# Patient Record
Sex: Female | Born: 1940 | Race: White | Hispanic: No | Marital: Married | Smoking: Never smoker
Health system: Southern US, Community
[De-identification: ages and names within clinical notes are randomized; demographics above are authoritative.]

## PROBLEM LIST (undated history)

## (undated) DIAGNOSIS — C801 Malignant (primary) neoplasm, unspecified: Secondary | ICD-10-CM

## (undated) DIAGNOSIS — I10 Essential (primary) hypertension: Secondary | ICD-10-CM

## (undated) DIAGNOSIS — E785 Hyperlipidemia, unspecified: Secondary | ICD-10-CM

## (undated) DIAGNOSIS — Z8489 Family history of other specified conditions: Secondary | ICD-10-CM

## (undated) DIAGNOSIS — E039 Hypothyroidism, unspecified: Secondary | ICD-10-CM

## (undated) DIAGNOSIS — K219 Gastro-esophageal reflux disease without esophagitis: Secondary | ICD-10-CM

## (undated) DIAGNOSIS — J189 Pneumonia, unspecified organism: Secondary | ICD-10-CM

## (undated) DIAGNOSIS — K8050 Calculus of bile duct without cholangitis or cholecystitis without obstruction: Secondary | ICD-10-CM

## (undated) DIAGNOSIS — M109 Gout, unspecified: Secondary | ICD-10-CM

## (undated) DIAGNOSIS — Z85038 Personal history of other malignant neoplasm of large intestine: Secondary | ICD-10-CM

## (undated) DIAGNOSIS — H269 Unspecified cataract: Secondary | ICD-10-CM

## (undated) DIAGNOSIS — K635 Polyp of colon: Secondary | ICD-10-CM

## (undated) DIAGNOSIS — E079 Disorder of thyroid, unspecified: Secondary | ICD-10-CM

## (undated) DIAGNOSIS — K5900 Constipation, unspecified: Secondary | ICD-10-CM

## (undated) HISTORY — DX: Unspecified cataract: H26.9

## (undated) HISTORY — DX: Polyp of colon: K63.5

## (undated) HISTORY — DX: Gout, unspecified: M10.9

## (undated) HISTORY — PX: POLYPECTOMY: SHX149

## (undated) HISTORY — PX: COLON SURGERY: SHX602

## (undated) HISTORY — DX: Essential (primary) hypertension: I10

## (undated) HISTORY — DX: Gastro-esophageal reflux disease without esophagitis: K21.9

## (undated) HISTORY — DX: Constipation, unspecified: K59.00

## (undated) HISTORY — DX: Hyperlipidemia, unspecified: E78.5

## (undated) HISTORY — DX: Disorder of thyroid, unspecified: E07.9

## (undated) HISTORY — PX: APPENDECTOMY: SHX54

## (undated) HISTORY — PX: ABDOMINAL HYSTERECTOMY: SHX81

## (undated) HISTORY — DX: Personal history of other malignant neoplasm of large intestine: Z85.038

## (undated) HISTORY — PX: TOTAL VAGINAL HYSTERECTOMY: SHX2548

---

## 1978-08-22 DIAGNOSIS — Z85038 Personal history of other malignant neoplasm of large intestine: Secondary | ICD-10-CM

## 1978-08-22 HISTORY — DX: Personal history of other malignant neoplasm of large intestine: Z85.038

## 1983-12-06 DIAGNOSIS — C180 Malignant neoplasm of cecum: Secondary | ICD-10-CM | POA: Insufficient documentation

## 2000-06-26 DIAGNOSIS — G9009 Other idiopathic peripheral autonomic neuropathy: Secondary | ICD-10-CM | POA: Insufficient documentation

## 2000-11-14 DIAGNOSIS — R609 Edema, unspecified: Secondary | ICD-10-CM | POA: Insufficient documentation

## 2001-02-12 DIAGNOSIS — D519 Vitamin B12 deficiency anemia, unspecified: Secondary | ICD-10-CM | POA: Insufficient documentation

## 2001-04-16 DIAGNOSIS — M84376A Stress fracture, unspecified foot, initial encounter for fracture: Secondary | ICD-10-CM | POA: Insufficient documentation

## 2003-03-19 DIAGNOSIS — E7800 Pure hypercholesterolemia, unspecified: Secondary | ICD-10-CM | POA: Insufficient documentation

## 2004-05-13 DIAGNOSIS — M109 Gout, unspecified: Secondary | ICD-10-CM | POA: Insufficient documentation

## 2004-08-30 DIAGNOSIS — J159 Unspecified bacterial pneumonia: Secondary | ICD-10-CM | POA: Insufficient documentation

## 2006-11-10 DIAGNOSIS — I272 Pulmonary hypertension, unspecified: Secondary | ICD-10-CM | POA: Insufficient documentation

## 2007-01-24 DIAGNOSIS — G4733 Obstructive sleep apnea (adult) (pediatric): Secondary | ICD-10-CM | POA: Insufficient documentation

## 2007-01-24 DIAGNOSIS — J449 Chronic obstructive pulmonary disease, unspecified: Secondary | ICD-10-CM | POA: Insufficient documentation

## 2007-03-05 DIAGNOSIS — IMO0002 Reserved for concepts with insufficient information to code with codable children: Secondary | ICD-10-CM | POA: Insufficient documentation

## 2007-05-21 ENCOUNTER — Ambulatory Visit: Payer: Self-pay | Admitting: Gastroenterology

## 2007-05-29 ENCOUNTER — Ambulatory Visit: Payer: Self-pay | Admitting: Gastroenterology

## 2007-08-29 DIAGNOSIS — Z79899 Other long term (current) drug therapy: Secondary | ICD-10-CM | POA: Insufficient documentation

## 2007-10-29 DIAGNOSIS — R319 Hematuria, unspecified: Secondary | ICD-10-CM | POA: Insufficient documentation

## 2008-08-20 DIAGNOSIS — J209 Acute bronchitis, unspecified: Secondary | ICD-10-CM | POA: Insufficient documentation

## 2010-12-16 DIAGNOSIS — C4490 Unspecified malignant neoplasm of skin, unspecified: Secondary | ICD-10-CM | POA: Insufficient documentation

## 2011-08-23 DIAGNOSIS — K635 Polyp of colon: Secondary | ICD-10-CM

## 2011-08-23 HISTORY — DX: Polyp of colon: K63.5

## 2011-08-23 HISTORY — PX: COLONOSCOPY: SHX174

## 2012-04-17 ENCOUNTER — Encounter: Payer: Self-pay | Admitting: Gastroenterology

## 2012-05-22 ENCOUNTER — Telehealth: Payer: Self-pay | Admitting: Gastroenterology

## 2012-05-22 NOTE — Telephone Encounter (Signed)
I have ordered her paper chart

## 2012-05-22 NOTE — Telephone Encounter (Signed)
There is no information in [facility] to make this decision. I need her paper chart. Recent office notes from her PCP. Prior colonoscopy reports. If we can't get this she can have an extender visit to put it all together.

## 2012-05-22 NOTE — Telephone Encounter (Signed)
Patient reports a remote history of colon CA.  She reports that she has a BM about every 3 days, but her stools are hard.  She was sent a colon recall letter in August.  Her primary care MD did examine her and told her to follow up with Dr. Fuller Plan that she may have some scarring from her previous colon resection in 1980s.  I have scheduled her for a recall colonoscopy for 06/26/12 and she will come for a pre-visit on 06/12/12.  She is advised to add Miralax prn.  Dr. Fuller Plan is it ok for her to be a direct?

## 2012-05-23 NOTE — Telephone Encounter (Signed)
Paper chart reviewed by Dr. Fuller Plan.  Per Dr. Fuller Plan ok for direct procedure

## 2012-06-12 ENCOUNTER — Ambulatory Visit (AMBULATORY_SURGERY_CENTER): Payer: Medicare Other

## 2012-06-12 VITALS — Ht 65.0 in | Wt 221.5 lb

## 2012-06-12 DIAGNOSIS — Z85038 Personal history of other malignant neoplasm of large intestine: Secondary | ICD-10-CM

## 2012-06-12 DIAGNOSIS — Z800 Family history of malignant neoplasm of digestive organs: Secondary | ICD-10-CM

## 2012-06-12 DIAGNOSIS — Z1211 Encounter for screening for malignant neoplasm of colon: Secondary | ICD-10-CM

## 2012-06-12 MED ORDER — MOVIPREP 100 G PO SOLR: ORAL | Status: DC

## 2012-06-26 ENCOUNTER — Encounter: Payer: Self-pay | Admitting: Gastroenterology

## 2012-06-26 ENCOUNTER — Ambulatory Visit (AMBULATORY_SURGERY_CENTER): Payer: Medicare Other | Admitting: Gastroenterology

## 2012-06-26 VITALS — BP 134/76 | HR 69 | Temp 97.3°F | Resp 16 | Ht 65.0 in | Wt 221.0 lb

## 2012-06-26 DIAGNOSIS — Z85038 Personal history of other malignant neoplasm of large intestine: Secondary | ICD-10-CM

## 2012-06-26 DIAGNOSIS — Z1211 Encounter for screening for malignant neoplasm of colon: Secondary | ICD-10-CM

## 2012-06-26 DIAGNOSIS — D126 Benign neoplasm of colon, unspecified: Secondary | ICD-10-CM

## 2012-06-26 MED ORDER — SODIUM CHLORIDE 0.9 % IV SOLN: 500.0000 mL | INTRAVENOUS | Status: DC

## 2012-06-26 NOTE — Op Note (Signed)
[place]  [address] [place], [id number]   COLONOSCOPY PROCEDURE REPORT  PATIENT: Jacqueline, Gamble  MR#: [id number] BIRTHDATE: 01-Jan-1941 , 71  yrs. old GENDER: Female ENDOSCOPIST: Ladene Artist, MD, [facility] PROCEDURE DATE:  06/26/2012 PROCEDURE:   Colonoscopy with biopsy ASA CLASS:   Class II INDICATIONS: high risk patient with personal history of colon cancer in 1986. MEDICATIONS: MAC sedation, administered by CRNA and propofol (Diprivan) 150mg  IV DESCRIPTION OF PROCEDURE:   After the risks benefits and alternatives of the procedure were thoroughly explained, informed consent was obtained.  A digital rectal exam revealed no abnormalities of the rectum.   The LB [id number]  endoscope was introduced through the anus and advanced to the cecum, which was identified by both the appendix and ileocecal valve. No adverse events experienced.   The quality of the prep was good, using MoviPrep  The instrument was then slowly withdrawn as the colon was fully examined.  COLON FINDINGS: Mild diverticulosis was noted in the descending colon.   A sessile polyp measuring 4 mm in size was found in the transverse colon.  A polypectomy was performed with cold forceps. The resection was complete and the polyp tissue was completely retrieved.   A sessile polyp measuring 3 mm in size was found in the descending colon.  A polypectomy was performed with cold forceps.  The resection was complete and the polyp tissue was completely retrieved.   Prior surgery, anastomosis in sigmoid colon.   The colon was otherwise normal.  There was no diverticulosis, inflammation, polyps or cancers unless previously stated.  Retroflexed views revealed small internal hemorrhoids. The time to cecum=3 minutes 10 seconds.  Withdrawal time=8 minutes 57 seconds.  The scope was withdrawn and the procedure completed. COMPLICATIONS: There were no complications.  ENDOSCOPIC IMPRESSION: 1.   Mild  diverticulosis was noted in the descending colon 2.   Sessile polyp measuring 4 mm in size was found in the transverse colon; polypectomy was performed with cold forceps 3.   Sessile polyp measuring 3 mm in size was found in the descending colon; polypectomy was performed with cold forceps 4.   Prior surgery, anastomosis in sigmoid colon. 5.   Small internal hemorrhoids  RECOMMENDATIONS: 1.  Await pathology results 2.  High fiber diet with liberal fluid intake. 3.  Repeat Colonoscopy in 5 years.  eSigned:  Ladene Artist, MD, [facility] 06/26/2012 2:18 PM

## 2012-06-26 NOTE — Progress Notes (Signed)
Patient did not experience any of the following events: a burn prior to discharge; a fall within the facility; wrong site/side/patient/procedure/implant event; or a hospital transfer or hospital admission upon discharge from the facility. (G8907) Patient did not have preoperative order for IV antibiotic SSI prophylaxis. (G8918)  

## 2012-06-26 NOTE — Progress Notes (Signed)
NAC-VSS Pt tolerated procedure well.Anesthesia uneventful

## 2012-06-26 NOTE — Patient Instructions (Addendum)
YOU HAD AN ENDOSCOPIC PROCEDURE TODAY AT THE [facility]: Refer to the procedure report that was given to you for any specific questions about what was found during the examination.  If the procedure report does not answer your questions, please call your gastroenterologist to clarify.  If you requested that your care partner not be given the details of your procedure findings, then the procedure report has been included in a sealed envelope for you to review at your convenience later.  YOU SHOULD EXPECT: Some feelings of bloating in the abdomen. Passage of more gas than usual.  Walking can help get rid of the air that was put into your GI tract during the procedure and reduce the bloating. If you had a lower endoscopy (such as a colonoscopy or flexible sigmoidoscopy) you may notice spotting of blood in your stool or on the toilet paper. If you underwent a bowel prep for your procedure, then you may not have a normal bowel movement for a few days.  DIET: Your first meal following the procedure should be a light meal and then it is ok to progress to your normal diet.  A half-sandwich or bowl of soup is an example of a good first meal.  Heavy or fried foods are harder to digest and may make you feel nauseous or bloated.  Likewise meals heavy in dairy and vegetables can cause extra gas to form and this can also increase the bloating.  Drink plenty of fluids but you should avoid alcoholic beverages for 24 hours.  ACTIVITY: Your care partner should take you home directly after the procedure.  You should plan to take it easy, moving slowly for the rest of the day.  You can resume normal activity the day after the procedure however you should NOT DRIVE or use heavy machinery for 24 hours (because of the sedation medicines used during the test).    SYMPTOMS TO REPORT IMMEDIATELY: A gastroenterologist can be reached at any hour.  During normal business hours, 8:30 AM to 5:00 PM Monday through Friday,  call [phone].  After hours and on weekends, please call the GI answering service at [phone] who will take a message and have the physician on call contact you.   Following lower endoscopy (colonoscopy or flexible sigmoidoscopy):  Excessive amounts of blood in the stool  Significant tenderness or worsening of abdominal pains  Swelling of the abdomen that is new, acute  Fever of 100F or higher  Following upper endoscopy (EGD)  Vomiting of blood or coffee ground material  New chest pain or pain under the shoulder blades  Painful or persistently difficult swallowing  New shortness of breath  Fever of 100F or higher  Black, tarry-looking stools  FOLLOW UP: If any biopsies were taken you will be contacted by phone or by letter within the next 1-3 weeks.  Call your gastroenterologist if you have not heard about the biopsies in 3 weeks.  Our staff will call the home number listed on your records the next business day following your procedure to check on you and address any questions or concerns that you may have at that time regarding the information given to you following your procedure. This is a courtesy call and so if there is no answer at the home number and we have not heard from you through the emergency physician on call, we will assume that you have returned to your regular daily activities without incident.  SIGNATURES/CONFIDENTIALITY: You and/or your care   partner have signed paperwork which will be entered into your electronic medical record.  These signatures attest to the fact that that the information above on your After Visit Summary has been reviewed and is understood.  Full responsibility of the confidentiality of this discharge information lies with you and/or your care-partner.  

## 2012-06-27 ENCOUNTER — Telehealth: Payer: Self-pay | Admitting: *Deleted

## 2012-06-27 NOTE — Telephone Encounter (Signed)
  Follow up Call-  Call back number 06/26/2012  Post procedure Call Back phone  # [phone]  Permission to leave phone message Yes   Spoke with pt's husband, she was out  Patient questions:  Do you have a fever, pain , or abdominal swelling? no Pain Score  0 *  Have you tolerated food without any problems? yes  Have you been able to return to your normal activities? yes  Do you have any questions about your discharge instructions: Diet   no Medications  no Follow up visit  no  Do you have questions or concerns about your Care? no  Actions: * If pain score is 4 or above: No action needed, pain <4.

## 2012-07-02 ENCOUNTER — Encounter: Payer: Self-pay | Admitting: Gastroenterology

## 2012-07-10 ENCOUNTER — Encounter: Payer: Self-pay | Admitting: Gastroenterology

## 2013-11-04 DIAGNOSIS — K5730 Diverticulosis of large intestine without perforation or abscess without bleeding: Secondary | ICD-10-CM | POA: Insufficient documentation

## 2013-11-11 ENCOUNTER — Encounter (HOSPITAL_COMMUNITY): Payer: Self-pay | Admitting: Emergency Medicine

## 2013-11-11 ENCOUNTER — Emergency Department (HOSPITAL_COMMUNITY): Payer: Medicare Other

## 2013-11-11 ENCOUNTER — Telehealth: Payer: Self-pay | Admitting: Gastroenterology

## 2013-11-11 ENCOUNTER — Encounter: Payer: Self-pay | Admitting: Gastroenterology

## 2013-11-11 ENCOUNTER — Inpatient Hospital Stay (HOSPITAL_COMMUNITY)
Admission: EM | Admit: 2013-11-11 | Discharge: 2013-11-13 | DRG: 419 | Disposition: A | Discharge status: Home / Self Care | Payer: Medicare Other | Attending: General Surgery | Admitting: General Surgery

## 2013-11-11 ENCOUNTER — Ambulatory Visit (INDEPENDENT_AMBULATORY_CARE_PROVIDER_SITE_OTHER): Payer: Medicare Other | Admitting: Gastroenterology

## 2013-11-11 VITALS — BP 168/100 | HR 108 | Ht 65.0 in | Wt 227.0 lb

## 2013-11-11 DIAGNOSIS — K812 Acute cholecystitis with chronic cholecystitis: Secondary | ICD-10-CM

## 2013-11-11 DIAGNOSIS — Z79899 Other long term (current) drug therapy: Secondary | ICD-10-CM

## 2013-11-11 DIAGNOSIS — L299 Pruritus, unspecified: Secondary | ICD-10-CM | POA: Diagnosis present

## 2013-11-11 DIAGNOSIS — R109 Unspecified abdominal pain: Secondary | ICD-10-CM

## 2013-11-11 DIAGNOSIS — I10 Essential (primary) hypertension: Secondary | ICD-10-CM

## 2013-11-11 DIAGNOSIS — K8010 Calculus of gallbladder with chronic cholecystitis without obstruction: Principal | ICD-10-CM | POA: Diagnosis present

## 2013-11-11 DIAGNOSIS — Z860100 Personal history of colon polyps, unspecified: Secondary | ICD-10-CM

## 2013-11-11 DIAGNOSIS — E669 Obesity, unspecified: Secondary | ICD-10-CM | POA: Diagnosis present

## 2013-11-11 DIAGNOSIS — M109 Gout, unspecified: Secondary | ICD-10-CM | POA: Diagnosis present

## 2013-11-11 DIAGNOSIS — Z85038 Personal history of other malignant neoplasm of large intestine: Secondary | ICD-10-CM

## 2013-11-11 DIAGNOSIS — R112 Nausea with vomiting, unspecified: Secondary | ICD-10-CM | POA: Diagnosis present

## 2013-11-11 DIAGNOSIS — E785 Hyperlipidemia, unspecified: Secondary | ICD-10-CM | POA: Diagnosis present

## 2013-11-11 DIAGNOSIS — Z6839 Body mass index (BMI) 39.0-39.9, adult: Secondary | ICD-10-CM

## 2013-11-11 DIAGNOSIS — K219 Gastro-esophageal reflux disease without esophagitis: Secondary | ICD-10-CM

## 2013-11-11 DIAGNOSIS — Z800 Family history of malignant neoplasm of digestive organs: Secondary | ICD-10-CM

## 2013-11-11 DIAGNOSIS — K635 Polyp of colon: Secondary | ICD-10-CM

## 2013-11-11 HISTORY — DX: Malignant (primary) neoplasm, unspecified: C80.1

## 2013-11-11 HISTORY — DX: Hypothyroidism, unspecified: E03.9

## 2013-11-11 LAB — CBC WITH DIFFERENTIAL/PLATELET
Basophils Absolute: 0 10*3/uL (ref 0.0–0.1)
Basophils Relative: 0 % (ref 0–1)
EOS PCT: 3 % (ref 0–5)
Eosinophils Absolute: 0.2 10*3/uL (ref 0.0–0.7)
HEMATOCRIT: 42.7 % (ref 36.0–46.0)
Hemoglobin: 13.8 g/dL (ref 12.0–15.0)
LYMPHS PCT: 23 % (ref 12–46)
Lymphs Abs: 2.1 10*3/uL (ref 0.7–4.0)
MCH: 29.9 pg (ref 26.0–34.0)
MCHC: 32.3 g/dL (ref 30.0–36.0)
MCV: 92.4 fL (ref 78.0–100.0)
Monocytes Absolute: 0.6 10*3/uL (ref 0.1–1.0)
Monocytes Relative: 6 % (ref 3–12)
Neutro Abs: 6 10*3/uL (ref 1.7–7.7)
Neutrophils Relative %: 67 % (ref 43–77)
PLATELETS: 358 10*3/uL (ref 150–400)
RBC: 4.62 MIL/uL (ref 3.87–5.11)
RDW: 16 % — ABNORMAL HIGH (ref 11.5–15.5)
WBC: 8.9 10*3/uL (ref 4.0–10.5)

## 2013-11-11 LAB — COMPREHENSIVE METABOLIC PANEL
ALBUMIN: 3.9 g/dL (ref 3.5–5.2)
ALT: 12 U/L (ref 0–35)
AST: 16 U/L (ref 0–37)
Alkaline Phosphatase: 90 U/L (ref 39–117)
BUN: 16 mg/dL (ref 6–23)
CO2: 28 mEq/L (ref 19–32)
CREATININE: 1.02 mg/dL (ref 0.50–1.10)
Calcium: 10.2 mg/dL (ref 8.4–10.5)
Chloride: 101 mEq/L (ref 96–112)
GFR calc Af Amer: 62 mL/min — ABNORMAL LOW (ref >90)
GFR calc non Af Amer: 53 mL/min — ABNORMAL LOW (ref >90)
Glucose, Bld: 111 mg/dL — ABNORMAL HIGH (ref 70–99)
Potassium: 3.7 mEq/L (ref 3.7–5.3)
Sodium: 142 mEq/L (ref 137–147)
TOTAL PROTEIN: 7.6 g/dL (ref 6.0–8.3)
Total Bilirubin: 0.3 mg/dL (ref 0.3–1.2)

## 2013-11-11 LAB — LIPASE, BLOOD: Lipase: 21 U/L (ref 11–59)

## 2013-11-11 MED ORDER — LIP MEDEX EX OINT
1.0000 | TOPICAL_OINTMENT | Freq: Two times a day (BID) | CUTANEOUS | Status: DC
Start: 2013-11-11 — End: 2013-11-13
  Administered 2013-11-11 – 2013-11-13 (×2): 1 via TOPICAL
  Filled 2013-11-11 (×2): qty 7

## 2013-11-11 MED ORDER — ACETAMINOPHEN 650 MG RE SUPP: 650.0000 mg | Freq: Four times a day (QID) | RECTAL | Status: DC | PRN

## 2013-11-11 MED ORDER — IOHEXOL 300 MG/ML  SOLN
100.0000 mL | Freq: Once | INTRAMUSCULAR | Status: AC | PRN
  Administered 2013-11-11: 100 mL via INTRAVENOUS

## 2013-11-11 MED ORDER — HYDRALAZINE HCL 100 MG PO TABS
100.0000 mg | ORAL_TABLET | Freq: Every day | ORAL | Status: DC
  Administered 2013-11-13: 100 mg via ORAL
  Filled 2013-11-11 (×2): qty 1

## 2013-11-11 MED ORDER — HEPARIN SODIUM (PORCINE) 5000 UNIT/ML IJ SOLN
5000.0000 [IU] | Freq: Three times a day (TID) | INTRAMUSCULAR | Status: DC
  Filled 2013-11-11 (×2): qty 1

## 2013-11-11 MED ORDER — PANTOPRAZOLE SODIUM 40 MG PO TBEC
40.0000 mg | DELAYED_RELEASE_TABLET | Freq: Every day | ORAL | Status: DC
  Administered 2013-11-13: 40 mg via ORAL
  Filled 2013-11-11 (×2): qty 1

## 2013-11-11 MED ORDER — SODIUM CHLORIDE 0.9 % IV SOLN
3.0000 g | Freq: Four times a day (QID) | INTRAVENOUS | Status: DC
  Administered 2013-11-11 – 2013-11-13 (×7): 3 g via INTRAVENOUS
  Filled 2013-11-11 (×9): qty 3

## 2013-11-11 MED ORDER — PROMETHAZINE HCL 25 MG/ML IJ SOLN: 6.2500 mg | Freq: Four times a day (QID) | INTRAMUSCULAR | Status: DC | PRN

## 2013-11-11 MED ORDER — LEVOTHYROXINE SODIUM 75 MCG PO TABS
75.0000 ug | ORAL_TABLET | Freq: Every day | ORAL | Status: DC
  Administered 2013-11-13: 75 ug via ORAL
  Filled 2013-11-11 (×3): qty 1

## 2013-11-11 MED ORDER — PROMETHAZINE HCL 25 MG/ML IJ SOLN
6.2500 mg | Freq: Four times a day (QID) | INTRAMUSCULAR | Status: DC | PRN
  Administered 2013-11-12: 12.5 mg via INTRAVENOUS
  Filled 2013-11-11: qty 1

## 2013-11-11 MED ORDER — DIPHENHYDRAMINE HCL 12.5 MG/5ML PO ELIX: 12.5000 mg | ORAL_SOLUTION | Freq: Four times a day (QID) | ORAL | Status: DC | PRN

## 2013-11-11 MED ORDER — ACETAMINOPHEN 325 MG PO TABS: 650.0000 mg | ORAL_TABLET | Freq: Four times a day (QID) | ORAL | Status: DC | PRN

## 2013-11-11 MED ORDER — POLYETHYLENE GLYCOL 3350 17 G PO PACK
17.0000 g | PACK | Freq: Two times a day (BID) | ORAL | Status: DC
  Administered 2013-11-11 – 2013-11-13 (×3): 17 g via ORAL
  Filled 2013-11-11 (×5): qty 1

## 2013-11-11 MED ORDER — ONDANSETRON HCL 4 MG/2ML IJ SOLN: 4.0000 mg | Freq: Four times a day (QID) | INTRAMUSCULAR | Status: DC | PRN

## 2013-11-11 MED ORDER — ALLOPURINOL 300 MG PO TABS
300.0000 mg | ORAL_TABLET | Freq: Every day | ORAL | Status: DC
  Administered 2013-11-13: 300 mg via ORAL
  Filled 2013-11-11 (×2): qty 1

## 2013-11-11 MED ORDER — BISACODYL 10 MG RE SUPP: 10.0000 mg | Freq: Two times a day (BID) | RECTAL | Status: DC | PRN

## 2013-11-11 MED ORDER — METOPROLOL TARTRATE 1 MG/ML IV SOLN
5.0000 mg | Freq: Four times a day (QID) | INTRAVENOUS | Status: DC | PRN
  Filled 2013-11-11: qty 5

## 2013-11-11 MED ORDER — OXYCODONE HCL 5 MG PO TABS
5.0000 mg | ORAL_TABLET | ORAL | Status: DC | PRN
  Administered 2013-11-11: 10 mg via ORAL
  Filled 2013-11-11: qty 2

## 2013-11-11 MED ORDER — IOHEXOL 300 MG/ML  SOLN
50.0000 mL | Freq: Once | INTRAMUSCULAR | Status: AC | PRN
  Administered 2013-11-11: 50 mL via ORAL

## 2013-11-11 MED ORDER — ASPIRIN 81 MG PO CHEW
81.0000 mg | CHEWABLE_TABLET | Freq: Every day | ORAL | Status: DC
  Administered 2013-11-13: 81 mg via ORAL
  Filled 2013-11-11 (×2): qty 1

## 2013-11-11 MED ORDER — LACTATED RINGERS IV SOLN
INTRAVENOUS | Status: DC
  Administered 2013-11-11 – 2013-11-12 (×4): via INTRAVENOUS
  Administered 2013-11-13: 75 mL via INTRAVENOUS

## 2013-11-11 MED ORDER — HYDROMORPHONE HCL PF 1 MG/ML IJ SOLN
0.5000 mg | INTRAMUSCULAR | Status: DC | PRN
  Administered 2013-11-12 (×2): 1 mg via INTRAVENOUS
  Filled 2013-11-11 (×2): qty 1

## 2013-11-11 MED ORDER — LACTATED RINGERS IV BOLUS (SEPSIS)
1000.0000 mL | Freq: Once | INTRAVENOUS | Status: AC
Start: 2013-11-11 — End: 2013-11-11
  Administered 2013-11-11: 1000 mL via INTRAVENOUS

## 2013-11-11 MED ORDER — DIPHENHYDRAMINE HCL 50 MG/ML IJ SOLN
12.5000 mg | Freq: Four times a day (QID) | INTRAMUSCULAR | Status: DC | PRN
  Administered 2013-11-12: 25 mg via INTRAVENOUS
  Filled 2013-11-11: qty 1

## 2013-11-11 MED ORDER — LACTATED RINGERS IV BOLUS (SEPSIS): 1000.0000 mL | Freq: Three times a day (TID) | INTRAVENOUS | Status: DC | PRN

## 2013-11-11 MED ORDER — POLYETHYLENE GLYCOL 3350 17 G PO PACK
17.0000 g | PACK | Freq: Two times a day (BID) | ORAL | Status: DC | PRN
  Filled 2013-11-11: qty 1

## 2013-11-11 MED ORDER — SUCRALFATE 1 GM/10ML PO SUSP
1.0000 g | Freq: Three times a day (TID) | ORAL | Status: DC
  Administered 2013-11-11 – 2013-11-13 (×5): 1 g via ORAL
  Filled 2013-11-11 (×10): qty 10

## 2013-11-11 MED ORDER — ALUM & MAG HYDROXIDE-SIMETH 200-200-20 MG/5ML PO SUSP: 30.0000 mL | Freq: Four times a day (QID) | ORAL | Status: DC | PRN

## 2013-11-11 NOTE — Patient Instructions (Signed)
Please go to the emergency room at  Hospital. 

## 2013-11-11 NOTE — Telephone Encounter (Signed)
Patient reports constipation and she was recently admitted to [facility] with the same.  She will bring the records.  She will come in and see Alonza Bogus, PA today at 3:30

## 2013-11-11 NOTE — ED Notes (Addendum)
Pt states she was hosptitalized im [place] on Thursday for small bowel obstruction. Pt states she has had pain with eating since then. No n/v/d pt states lower abd hurts with eating. Pt states she has had several inches of small intestine removed due to SBO, and several inches of large intestine removed due to colon ca several years ago.

## 2013-11-11 NOTE — Consult Note (Signed)
[place], MD, [address] [place], [place] [address] Phone: [phone] FAX: [place]  1941/06/27 [id number]  CARE TEAM:  PCP: Galen Manila, MD  Outpatient Care Team: Patient Care Team: Galen Manila as PCP - General (Internal Medicine)  Inpatient Treatment Team: Treatment Team: Attending Provider: Dot Lanes, MD; Registered Nurse: Susette Racer, RN; Registered Nurse: Waunita Schooner, RN  This patient is a 73 y.o.female who presents today for surgical evaluation at the request of Dr Audie Pinto.   Reason for evaluation: Postprandial nausea vomiting and upper abdominal pain.  Gallstones.  Pleasant obese female who has struggled with daily nausea and vomiting after eating for the past several weeks.  She is a distant history of and colon cancer requiring operation resection by Dr Lennie Hummer in 1980.  Then the had had to have reoperation 4 years later concerning for bowel obstruction.  No problems since.  She has been followed by low-power gastroenterology with endoscopies.  Last colonoscopy revealed tubular adenomas in 2013.  She has a bowel movement about every other day.  No major change in caliber of her stools.  No personal nor family history of inflammatory bowel disease, irritable bowel syndrome, allergy such as Celiac Sprue, dietary/dairy problems, colitis, ulcers nor gastritis.  No recent sick contacts/gastroenteritis.  No travel outside the country.  No changes in diet.  No dysphagia to solids or liquids.  No significant heartburn or reflux but is on a daily PPI.Marland Kitchen  No hematochezia, hematemesis, coffee ground emesis.  No evidence of prior gastric/peptic ulceration.  Was admitted up in [place], [place], where she lives, over concerns of a bowel obstruction.  Apparently stay for 5 days.  Because of her daily vomiting and other concerns, she went to seek  gastroenterology.  Apparently in tears, feeling uncomfortable.  Recommended coming to emergency room.  CAT scan argues against SBO / obstruction.  Large gallstone and gallbladder noted.  Some mild pericholecystic fluid as well.  Pain seemed to be more in right upper abdomen.      Past Medical History  Diagnosis Date  . Hypertension   . Gout   . Thyroid disease   . History of colon cancer 1980  . Colon polyp 2013    TUBULAR ADENOMA  . Hyperlipemia   . GERD (gastroesophageal reflux disease)     Past Surgical History  Procedure Laterality Date  . Colonoscopy  2013  . Colon surgery       2 times/1980 and 1983  . Total vaginal hysterectomy    . Appendectomy      History   Social History  . Marital Status: Married    Spouse Name: N/A    Number of Children: N/A  . Years of Education: N/A   Occupational History  . Not on file.   Social History Main Topics  . Smoking status: Never Smoker   . Smokeless tobacco: Never Used  . Alcohol Use: No  . Drug Use: No  . Sexual Activity: Not on file   Other Topics Concern  . Not on file   Social History Narrative  . No narrative on file    Family History  Problem Relation Age of Onset  . Colon cancer Maternal Grandfather   . Colon polyps Son     No current facility-administered medications for this encounter.   Current Outpatient Prescriptions  Medication Sig  Dispense Refill  . aliskiren (TEKTURNA) 150 MG tablet Take 150 mg by mouth daily.      Marland Kitchen allopurinol (ZYLOPRIM) 300 MG tablet Take 300 mg by mouth daily.      Marland Kitchen aspirin 81 MG tablet Take 81 mg by mouth daily.      Marland Kitchen docusate sodium (COLACE) 100 MG capsule Take 100 mg by mouth 2 (two) times daily.      . hydrALAZINE (APRESOLINE) 100 MG tablet Take 100 mg by mouth daily.      Marland Kitchen levothyroxine (SYNTHROID, LEVOTHROID) 75 MCG tablet Take 75 mcg by mouth daily before breakfast.      . Multiple Vitamins-Calcium (ONE-A-DAY WOMENS FORMULA PO) Take by mouth daily.      .  pantoprazole (PROTONIX) 40 MG tablet Take 40 mg by mouth daily.      . pravastatin (PRAVACHOL) 40 MG tablet Take 40 mg by mouth daily.      Marland Kitchen torsemide (DEMADEX) 20 MG tablet Take 20 mg by mouth daily.         Allergies  Allergen Reactions  . Iodine Swelling  . Codeine Itching and Rash    ROS: Constitutional:  No fevers, chills, sweats.  Weight stable Eyes:  No vision changes, No discharge HENT:  No sore throats, nasal drainage Lymph: No neck swelling, No bruising easily Pulmonary:  No cough, productive sputum CV: No orthopnea, PND  Patient walks 15 minutes for about 1/4 miles without difficulty.  No exertional chest/neck/shoulder/arm pain. GI:  No personal nor family history of inflammatory bowel disease, irritable bowel syndrome, allergy such as Celiac Sprue, dietary/dairy problems, colitis, ulcers nor gastritis.  No recent sick contacts/gastroenteritis.  No travel outside the country.  No changes in diet. Renal: No UTIs, No hematuria Genital:  No drainage, bleeding, masses Musculoskeletal: No severe joint pain.  Good ROM major joints Skin:  No sores or lesions.  No rashes Heme/Lymph:  No easy bleeding.  No swollen lymph nodes Neuro: No focal weakness/numbness.  No seizures Psych: No suicidal ideation.  No hallucinations  BP 182/88  Pulse 88  Temp(Src) 98 F (36.7 C) (Oral)  Resp 20  SpO2 95%  Physical Exam: General: Pt awake/alert/oriented x4 in no major acute distress Eyes: PERRL, normal EOM. Sclera nonicteric Neuro: CN II-XII intact w/o focal sensory/motor deficits. Lymph: No head/neck/groin lymphadenopathy Psych:  No delerium/psychosis/paranoia HENT: Normocephalic, Mucus membranes moist.  No thrush Neck: Supple, No tracheal deviation Chest: No pain.  Good respiratory excursion. CV:  Pulses intact.  Regular rhythm Abdomen: Soft, Nondistended.  Mod tender RUQ with mild Murphy sign to deep palpation.  Rest of the abdomen soft and nontender..  No incarcerated  hernias. Ext:  SCDs BLE.  No significant edema.  No cyanosis Skin: No petechiae / purpurea.  No major sores Musculoskeletal: No severe joint pain.  Good ROM major joints   Results:   Labs: Results for orders placed during the hospital encounter of 11/11/13 (from the past 48 hour(s))  LIPASE, BLOOD     Status: None   Collection Time    11/11/13  6:40 PM      Result Value Ref Range   Lipase 21  11 - 59 U/L  COMPREHENSIVE METABOLIC PANEL     Status: Abnormal   Collection Time    11/11/13  6:40 PM      Result Value Ref Range   Sodium 142  137 - 147 mEq/L   Potassium 3.7  3.7 - 5.3 mEq/L   Chloride 101  96 - 112 mEq/L   CO2 28  19 - 32 mEq/L   Glucose, Bld 111 (*) 70 - 99 mg/dL   BUN 16  6 - 23 mg/dL   Creatinine, Ser 1.02  0.50 - 1.10 mg/dL   Calcium 10.2  8.4 - 10.5 mg/dL   Total Protein 7.6  6.0 - 8.3 g/dL   Albumin 3.9  3.5 - 5.2 g/dL   AST 16  0 - 37 U/L   ALT 12  0 - 35 U/L   Alkaline Phosphatase 90  39 - 117 U/L   Total Bilirubin 0.3  0.3 - 1.2 mg/dL   GFR calc non Af Amer 53 (*) >90 mL/min   GFR calc Af Amer 62 (*) >90 mL/min   Comment: (NOTE)     The eGFR has been calculated using the CKD EPI equation.     This calculation has not been validated in all clinical situations.     eGFR's persistently <90 mL/min signify possible Chronic Kidney     Disease.  CBC WITH DIFFERENTIAL     Status: Abnormal   Collection Time    11/11/13  6:40 PM      Result Value Ref Range   WBC 8.9  4.0 - 10.5 K/uL   RBC 4.62  3.87 - 5.11 MIL/uL   Hemoglobin 13.8  12.0 - 15.0 g/dL   HCT 42.7  36.0 - 46.0 %   MCV 92.4  78.0 - 100.0 fL   MCH 29.9  26.0 - 34.0 pg   MCHC 32.3  30.0 - 36.0 g/dL   RDW 16.0 (*) 11.5 - 15.5 %   Platelets 358  150 - 400 K/uL   Neutrophils Relative % 67  43 - 77 %   Neutro Abs 6.0  1.7 - 7.7 K/uL   Lymphocytes Relative 23  12 - 46 %   Lymphs Abs 2.1  0.7 - 4.0 K/uL   Monocytes Relative 6  3 - 12 %   Monocytes Absolute 0.6  0.1 - 1.0 K/uL   Eosinophils  Relative 3  0 - 5 %   Eosinophils Absolute 0.2  0.0 - 0.7 K/uL   Basophils Relative 0  0 - 1 %   Basophils Absolute 0.0  0.0 - 0.1 K/uL    Imaging / Studies: Ct Abdomen Pelvis W Contrast  11/11/2013   CLINICAL DATA:  73 year old female with abdominal and pelvic pain. History of colon cancer and prior small bowel obstructions.  EXAM: CT ABDOMEN AND PELVIS WITH CONTRAST  TECHNIQUE: Multidetector CT imaging of the abdomen and pelvis was performed using the standard protocol following bolus administration of intravenous contrast.  CONTRAST:  149m OMNIPAQUE IOHEXOL 300 MG/ML  SOLN  COMPARISON:  None.  FINDINGS: The liver, spleen, adrenal glands, pancreas are unremarkable.  Cholelithiasis identify with possible mall pericholecystic inflammation and acute cholecystitis is not excluded.  Left renal atrophy and right renal cysts are present.  There is no evidence of free fluid, enlarged lymph nodes, biliary dilation or abdominal aortic aneurysm.  Prior bowel surgery noted.  There is no evidence of bowel obstruction, pneumoperitoneum or abscess.  The patient is status post hysterectomy.  The bladder is unremarkable.  No acute or suspicious bony abnormalities are present.  IMPRESSION: Cholelithiasis with possible mild pericholecystic inflammation. Acute cholecystitis is not excluded and consider further evaluation with ultrasound or nuclear medicine study.   Electronically Signed   By: JHassan RowanM.D.   On: 11/11/2013 21:01    Medications /  Allergies: per chart  Antibiotics: Anti-infectives   None      Assessment  [name]  73 y.o. female       Problem List:  Principal Problem:   Acute cholecystitis with chronic cholecystitis Active Problems:   Personal history of colonic polyps   History of colon cancer   GERD (gastroesophageal reflux disease)   Hypertension   Obesity (BMI 30-39.9)   Postprandial nausea vomiting without constant pain suspicious for acute on chronic  cholecystitis  Plan:  Admit.  IV antibiotics.  Unison for biliary coverage.  Progressive nausea control.  Cholecystectomy.  Reasonable to start a laparoscopically area and I discussed with the patient and her significant other:  The anatomy & physiology of hepatobiliary & pancreatic function was discussed.  The pathophysiology of gallbladder dysfunction was discussed.  Natural history risks without surgery was discussed.   I feel the risks of no intervention will lead to serious problems that outweigh the operative risks; therefore, I recommended cholecystectomy to remove the pathology.  I explained laparoscopic techniques with possible need for an open approach.  Probable cholangiogram to evaluate the bilary tract was explained as well.    Risks such as bleeding, infection, abscess, leak, injury to other organs, need for further treatment, heart attack, death, and other risks were discussed.  I noted a good likelihood this will help address the problem.  Possibility that this will not correct all abdominal symptoms was explained.  Goals of post-operative recovery were discussed as well.  We will work to minimize complications.  An educational handout further explaining the pathology and treatment options was given as well.  Questions were answered.  The patient expresses understanding & wishes to proceed with surgery.  Follow hypertension.  Control when necessary.  Continue allopurinol for gout.  -VTE prophylaxis- SCDs, etc.  Hold heparin until after surgery.  Anticipate surgery in a.m.  -mobilize as tolerated to help recovery    Adin Hector, M.D., F.A.C.S. Gastrointestinal and Minimally Invasive Surgery [facility] [address] [place],  [address] [phone] Main / Paging   11/11/2013  Note: This dictation was prepared with Dragon/digital dictation along with [facility] technology. Any transcriptional errors that result from  this process are unintentional.

## 2013-11-12 ENCOUNTER — Encounter (HOSPITAL_COMMUNITY): Payer: Self-pay

## 2013-11-12 ENCOUNTER — Inpatient Hospital Stay (HOSPITAL_COMMUNITY): Payer: Medicare Other | Admitting: Certified Registered Nurse Anesthetist

## 2013-11-12 ENCOUNTER — Encounter (HOSPITAL_COMMUNITY): Admission: EM | Disposition: A | Discharge status: Home / Self Care | Payer: Self-pay | Source: Home / Self Care

## 2013-11-12 ENCOUNTER — Encounter: Payer: Self-pay | Admitting: Gastroenterology

## 2013-11-12 ENCOUNTER — Encounter (HOSPITAL_COMMUNITY): Payer: Medicare Other | Admitting: Certified Registered Nurse Anesthetist

## 2013-11-12 ENCOUNTER — Inpatient Hospital Stay (HOSPITAL_COMMUNITY): Payer: Medicare Other

## 2013-11-12 DIAGNOSIS — R109 Unspecified abdominal pain: Secondary | ICD-10-CM | POA: Insufficient documentation

## 2013-11-12 DIAGNOSIS — K8010 Calculus of gallbladder with chronic cholecystitis without obstruction: Secondary | ICD-10-CM

## 2013-11-12 HISTORY — PX: CHOLECYSTECTOMY: SHX55

## 2013-11-12 LAB — SURGICAL PCR SCREEN
MRSA, PCR: NEGATIVE
Staphylococcus aureus: NEGATIVE

## 2013-11-12 SURGERY — LAPAROSCOPIC CHOLECYSTECTOMY WITH INTRAOPERATIVE CHOLANGIOGRAM
Anesthesia: General | Site: Abdomen

## 2013-11-12 MED ORDER — PROMETHAZINE HCL 25 MG/ML IJ SOLN
6.2500 mg | INTRAMUSCULAR | Status: DC | PRN
  Administered 2013-11-12: 6.25 mg via INTRAVENOUS

## 2013-11-12 MED ORDER — GLYCOPYRROLATE 0.2 MG/ML IJ SOLN
INTRAMUSCULAR | Status: DC | PRN
  Administered 2013-11-12: .8 mg via INTRAVENOUS

## 2013-11-12 MED ORDER — PROPOFOL 10 MG/ML IV BOLUS
INTRAVENOUS | Status: AC
  Filled 2013-11-12: qty 20

## 2013-11-12 MED ORDER — NEOSTIGMINE METHYLSULFATE 1 MG/ML IJ SOLN
INTRAMUSCULAR | Status: AC
  Filled 2013-11-12: qty 10

## 2013-11-12 MED ORDER — ROCURONIUM BROMIDE 100 MG/10ML IV SOLN
INTRAVENOUS | Status: DC | PRN
  Administered 2013-11-12: 40 mg via INTRAVENOUS

## 2013-11-12 MED ORDER — ROCURONIUM BROMIDE 100 MG/10ML IV SOLN
INTRAVENOUS | Status: AC
  Filled 2013-11-12: qty 1

## 2013-11-12 MED ORDER — SODIUM CHLORIDE 0.9 % IJ SOLN
INTRAMUSCULAR | Status: DC | PRN
  Administered 2013-11-12: 13:00:00

## 2013-11-12 MED ORDER — SUCCINYLCHOLINE CHLORIDE 20 MG/ML IJ SOLN
INTRAMUSCULAR | Status: DC | PRN
  Administered 2013-11-12: 140 mg via INTRAVENOUS

## 2013-11-12 MED ORDER — HYDROMORPHONE HCL PF 1 MG/ML IJ SOLN
INTRAMUSCULAR | Status: AC
  Filled 2013-11-12: qty 1

## 2013-11-12 MED ORDER — DEXAMETHASONE SODIUM PHOSPHATE 10 MG/ML IJ SOLN
INTRAMUSCULAR | Status: DC | PRN
  Administered 2013-11-12: 10 mg via INTRAVENOUS

## 2013-11-12 MED ORDER — DIPHENHYDRAMINE HCL 50 MG/ML IJ SOLN
12.5000 mg | Freq: Once | INTRAMUSCULAR | Status: AC
  Administered 2013-11-12: 12.5 mg via INTRAVENOUS

## 2013-11-12 MED ORDER — FENTANYL CITRATE 0.05 MG/ML IJ SOLN
INTRAMUSCULAR | Status: AC
  Filled 2013-11-12: qty 2

## 2013-11-12 MED ORDER — LIDOCAINE HCL (CARDIAC) 20 MG/ML IV SOLN
INTRAVENOUS | Status: AC
  Filled 2013-11-12: qty 5

## 2013-11-12 MED ORDER — 0.9 % SODIUM CHLORIDE (POUR BTL) OPTIME
TOPICAL | Status: DC | PRN
  Administered 2013-11-12: 1000 mL

## 2013-11-12 MED ORDER — TRAMADOL HCL 50 MG PO TABS: 100.0000 mg | ORAL_TABLET | Freq: Two times a day (BID) | ORAL | Status: DC | PRN

## 2013-11-12 MED ORDER — PROMETHAZINE HCL 25 MG/ML IJ SOLN
INTRAMUSCULAR | Status: AC
  Filled 2013-11-12: qty 1

## 2013-11-12 MED ORDER — HYDROMORPHONE HCL PF 1 MG/ML IJ SOLN
0.2500 mg | INTRAMUSCULAR | Status: DC | PRN
  Administered 2013-11-12: 0.5 mg via INTRAVENOUS

## 2013-11-12 MED ORDER — LIDOCAINE HCL (CARDIAC) 20 MG/ML IV SOLN
INTRAVENOUS | Status: DC | PRN
  Administered 2013-11-12: 100 mg via INTRAVENOUS

## 2013-11-12 MED ORDER — MEPERIDINE HCL 50 MG/ML IJ SOLN: 6.2500 mg | INTRAMUSCULAR | Status: DC | PRN

## 2013-11-12 MED ORDER — MIDAZOLAM HCL 2 MG/2ML IJ SOLN
INTRAMUSCULAR | Status: AC
  Filled 2013-11-12: qty 2

## 2013-11-12 MED ORDER — NEOSTIGMINE METHYLSULFATE 1 MG/ML IJ SOLN
INTRAMUSCULAR | Status: DC | PRN
  Administered 2013-11-12: 5 mg via INTRAVENOUS

## 2013-11-12 MED ORDER — PROPOFOL 10 MG/ML IV BOLUS
INTRAVENOUS | Status: DC | PRN
  Administered 2013-11-12: 170 mg via INTRAVENOUS

## 2013-11-12 MED ORDER — DIPHENHYDRAMINE HCL 50 MG/ML IJ SOLN
INTRAMUSCULAR | Status: AC
Start: 2013-11-12 — End: 2013-11-13
  Filled 2013-11-12: qty 1

## 2013-11-12 MED ORDER — GLYCOPYRROLATE 0.2 MG/ML IJ SOLN
INTRAMUSCULAR | Status: AC
  Filled 2013-11-12: qty 3

## 2013-11-12 MED ORDER — FENTANYL CITRATE 0.05 MG/ML IJ SOLN
INTRAMUSCULAR | Status: AC
  Filled 2013-11-12: qty 5

## 2013-11-12 MED ORDER — PHENYLEPHRINE HCL 10 MG/ML IJ SOLN
INTRAMUSCULAR | Status: DC | PRN
  Administered 2013-11-12: 80 ug via INTRAVENOUS
  Administered 2013-11-12: 40 ug via INTRAVENOUS

## 2013-11-12 MED ORDER — DEXAMETHASONE SODIUM PHOSPHATE 10 MG/ML IJ SOLN
INTRAMUSCULAR | Status: AC
  Filled 2013-11-12: qty 1

## 2013-11-12 MED ORDER — BUPIVACAINE-EPINEPHRINE PF 0.25-1:200000 % IJ SOLN
INTRAMUSCULAR | Status: AC
  Filled 2013-11-12: qty 30

## 2013-11-12 MED ORDER — PHENYLEPHRINE 40 MCG/ML (10ML) SYRINGE FOR IV PUSH (FOR BLOOD PRESSURE SUPPORT)
PREFILLED_SYRINGE | INTRAVENOUS | Status: AC
  Filled 2013-11-12: qty 10

## 2013-11-12 MED ORDER — LACTATED RINGERS IV SOLN
INTRAVENOUS | Status: DC | PRN
  Administered 2013-11-12: 1000 mL

## 2013-11-12 MED ORDER — FENTANYL CITRATE 0.05 MG/ML IJ SOLN
INTRAMUSCULAR | Status: DC | PRN
  Administered 2013-11-12 (×3): 50 ug via INTRAVENOUS
  Administered 2013-11-12: 100 ug via INTRAVENOUS

## 2013-11-12 MED ORDER — BUPIVACAINE-EPINEPHRINE 0.25% -1:200000 IJ SOLN
INTRAMUSCULAR | Status: DC | PRN
  Administered 2013-11-12: 20 mL

## 2013-11-12 MED ORDER — HEPARIN SODIUM (PORCINE) 5000 UNIT/ML IJ SOLN
5000.0000 [IU] | Freq: Three times a day (TID) | INTRAMUSCULAR | Status: DC
  Administered 2013-11-13 (×2): 5000 [IU] via SUBCUTANEOUS
  Filled 2013-11-12 (×4): qty 1

## 2013-11-12 MED ORDER — DIPHENHYDRAMINE HCL 50 MG/ML IJ SOLN
12.5000 mg | INTRAMUSCULAR | Status: AC
  Administered 2013-11-12: 12.5 mg via INTRAVENOUS

## 2013-11-12 MED ORDER — ONDANSETRON HCL 4 MG/2ML IJ SOLN
INTRAMUSCULAR | Status: AC
  Filled 2013-11-12: qty 2

## 2013-11-12 MED ORDER — ONDANSETRON HCL 4 MG/2ML IJ SOLN
INTRAMUSCULAR | Status: DC | PRN
  Administered 2013-11-12: 4 mg via INTRAVENOUS

## 2013-11-12 SURGICAL SUPPLY — 35 items
ADH SKN CLS APL DERMABOND .7 (GAUZE/BANDAGES/DRESSINGS) ×1
APPLIER CLIP ROT 10 11.4 M/L (STAPLE) ×2
APR CLP MED LRG 11.4X10 (STAPLE) ×1
BAG SPEC RTRVL LRG 6X4 10 (ENDOMECHANICALS) ×1
CANISTER SUCTION 2500CC (MISCELLANEOUS) ×2 IMPLANT
CATH REDDICK CHOLANGI 4FR 50CM (CATHETERS) ×2 IMPLANT
CHLORAPREP W/TINT 26ML (MISCELLANEOUS) ×2 IMPLANT
CLIP APPLIE ROT 10 11.4 M/L (STAPLE) ×1 IMPLANT
COVER MAYO STAND STRL (DRAPES) ×2 IMPLANT
DECANTER SPIKE VIAL GLASS SM (MISCELLANEOUS) ×1 IMPLANT
DERMABOND ADVANCED (GAUZE/BANDAGES/DRESSINGS) ×1
DERMABOND ADVANCED .7 DNX12 (GAUZE/BANDAGES/DRESSINGS) ×1 IMPLANT
DRAPE C-ARM 42X120 X-RAY (DRAPES) ×2 IMPLANT
DRAPE LAPAROSCOPIC ABDOMINAL (DRAPES) ×2 IMPLANT
ELECT REM PT RETURN 9FT ADLT (ELECTROSURGICAL) ×2
ELECTRODE REM PT RTRN 9FT ADLT (ELECTROSURGICAL) ×1 IMPLANT
GLOVE BIO SURGEON STRL SZ7.5 (GLOVE) ×5 IMPLANT
GOWN STRL REUS W/ TWL XL LVL3 (GOWN DISPOSABLE) IMPLANT
GOWN STRL REUS W/TWL LRG LVL3 (GOWN DISPOSABLE) ×1 IMPLANT
GOWN STRL REUS W/TWL XL LVL3 (GOWN DISPOSABLE) ×6 IMPLANT
HEMOSTAT SURGICEL 4X8 (HEMOSTASIS) ×2 IMPLANT
IV CATH 14GX2 1/4 (CATHETERS) ×2 IMPLANT
KIT BASIN OR (CUSTOM PROCEDURE TRAY) ×2 IMPLANT
POUCH SPECIMEN RETRIEVAL 10MM (ENDOMECHANICALS) ×1 IMPLANT
SET IRRIG TUBING LAPAROSCOPIC (IRRIGATION / IRRIGATOR) ×2 IMPLANT
SOLUTION ANTI FOG 6CC (MISCELLANEOUS) ×2 IMPLANT
SUT MNCRL AB 4-0 PS2 18 (SUTURE) ×2 IMPLANT
TOWEL OR 17X26 10 PK STRL BLUE (TOWEL DISPOSABLE) ×2 IMPLANT
TOWEL OR NON WOVEN STRL DISP B (DISPOSABLE) ×2 IMPLANT
TRAY LAP CHOLE (CUSTOM PROCEDURE TRAY) ×2 IMPLANT
TROCAR BLADELESS OPT 5 75 (ENDOMECHANICALS) ×2 IMPLANT
TROCAR SLEEVE XCEL 5X75 (ENDOMECHANICALS) ×3 IMPLANT
TROCAR XCEL BLUNT TIP 100MML (ENDOMECHANICALS) ×1 IMPLANT
TROCAR XCEL NON-BLD 11X100MML (ENDOMECHANICALS) ×2 IMPLANT
TUBING INSUFFLATION 10FT LAP (TUBING) ×2 IMPLANT

## 2013-11-12 NOTE — Transfer of Care (Signed)
Immediate Anesthesia Transfer of Care Note  Patient: Jacqueline Gamble  Procedure(s) Performed: Procedure(s) (LRB): LAPAROSCOPIC CHOLECYSTECTOMY WITH INTRAOPERATIVE CHOLANGIOGRAM (N/A)  Patient Location: PACU  Anesthesia Type: General  Level of Consciousness: sedated, patient cooperative and responds to stimulation  Airway & Oxygen Therapy: Patient Spontanous Breathing and Patient connected to face mask oxgen  Post-op Assessment: Report given to PACU RN and Post -op Vital signs reviewed and stable  Post vital signs: Reviewed and stable  Complications: No apparent anesthesia complications

## 2013-11-12 NOTE — Discharge Instructions (Signed)
[facility] ______[facility] °LAPAROSCOPIC SURGERY: POST OP INSTRUCTIONS °Always review your discharge instruction sheet given to you by the facility where your surgery was performed. °IF YOU HAVE DISABILITY OR FAMILY LEAVE FORMS, YOU MUST BRING THEM TO THE OFFICE FOR PROCESSING.   °DO NOT GIVE THEM TO YOUR DOCTOR. ° °1. A prescription for pain medication may be given to you upon discharge.  Take your pain medication as prescribed, if needed.  If narcotic pain medicine is not needed, then you may take acetaminophen (Tylenol) or ibuprofen (Advil) as needed. °2. Take your usually prescribed medications unless otherwise directed. °3. If you need a refill on your pain medication, please contact your pharmacy.  They will contact our office to request authorization. Prescriptions will not be filled after 5pm or on week-ends. °4. You should follow a light diet the first few days after arrival home, such as soup and crackers, etc.  Be sure to include lots of fluids daily. °5. Most patients will experience some swelling and bruising in the area of the incisions.  Ice packs will help.  Swelling and bruising can take several days to resolve.  °6. It is common to experience some constipation if taking pain medication after surgery.  Increasing fluid intake and taking a stool softener (such as Colace) will usually help or prevent this problem from occurring.  A mild laxative (Milk of Magnesia or Miralax) should be taken according to package instructions if there are no bowel movements after 48 hours. °7. Unless discharge instructions indicate otherwise, you may remove your bandages 24-48 hours after surgery, and you may shower at that time.  You may have steri-strips (small skin tapes) in place directly over the incision.  These strips should be left on the skin for 7-10 days.  If your surgeon used skin glue on the incision, you may shower in 24 hours.  The glue will flake off over the next 2-3 weeks.  Any sutures or  staples will be removed at the office during your follow-up visit. °8. ACTIVITIES:  You may resume regular (light) daily activities beginning the next day--such as daily self-care, walking, climbing stairs--gradually increasing activities as tolerated.  You may have sexual intercourse when it is comfortable.  Refrain from any heavy lifting or straining until approved by your doctor. °a. You may drive when you are no longer taking prescription pain medication, you can comfortably wear a seatbelt, and you can safely maneuver your car and apply brakes. °b. RETURN TO WORK:  __________________________________________________________ °9. You should see your doctor in the office for a follow-up appointment approximately 2-3 weeks after your surgery.  Make sure that you call for this appointment within a day or two after you arrive home to insure a convenient appointment time. °10. OTHER INSTRUCTIONS: __________________________________________________________________________________________________________________________ __________________________________________________________________________________________________________________________ °WHEN TO CALL YOUR DOCTOR: °1. Fever over 101.0 °2. Inability to urinate °3. Continued bleeding from incision. °4. Increased pain, redness, or drainage from the incision. °5. Increasing abdominal pain ° °The clinic staff is available to answer your questions during regular business hours.  Please don’t hesitate to call and ask to speak to one of the nurses for clinical concerns.  If you have a medical emergency, go to the nearest emergency room or call 911.  A surgeon from [facility] is always on call at the hospital. °[address], [address], [place], [place]  [address] ? [address] [address], [place], [place]   [address] °[phone] ? [phone] ? FAX [phone] °Web site:   [email or web address] °

## 2013-11-12 NOTE — Progress Notes (Signed)
  Subjective: No complaints other than soreness in RUQ  Objective: Vital signs in last 24 hours: Temp:  [98 F (36.7 C)-98.5 F (36.9 C)] 98.5 F (36.9 C) (03/24 0654) Pulse Rate:  [75-108] 75 (03/24 0654) Resp:  [18-20] 18 (03/24 0654) BP: (153-182)/(85-100) 153/85 mmHg (03/24 0654) SpO2:  [94 %-96 %] 94 % (03/24 0654) Weight:  [227 lb (102.967 kg)] 227 lb (102.967 kg) (03/23 2246) Last BM Date: 11/10/13  Intake/Output from previous day: 03/23 0701 - 03/24 0700 In: 1947.5 [P.O.:120; I.V.:627.5; IV Piggyback:1200] Out: 1550 [Urine:1550] Intake/Output this shift:    Resp: clear to auscultation bilaterally Cardio: regular rate and rhythm GI: soft, mild tenderness RUQ  Lab Results:   Recent Labs  11/11/13 1840  WBC 8.9  HGB 13.8  HCT 42.7  PLT 358   BMET  Recent Labs  11/11/13 1840  NA 142  K 3.7  CL 101  CO2 28  GLUCOSE 111*  BUN 16  CREATININE 1.02  CALCIUM 10.2   PT/INR No results found for this basename: LABPROT, INR,  in the last 72 hours ABG No results found for this basename: PHART, PCO2, PO2, HCO3,  in the last 72 hours  Studies/Results: Ct Abdomen Pelvis W Contrast  11/11/2013   CLINICAL DATA:  73 year old female with abdominal and pelvic pain. History of colon cancer and prior small bowel obstructions.  EXAM: CT ABDOMEN AND PELVIS WITH CONTRAST  TECHNIQUE: Multidetector CT imaging of the abdomen and pelvis was performed using the standard protocol following bolus administration of intravenous contrast.  CONTRAST:  133mL OMNIPAQUE IOHEXOL 300 MG/ML  SOLN  COMPARISON:  None.  FINDINGS: The liver, spleen, adrenal glands, pancreas are unremarkable.  Cholelithiasis identify with possible mall pericholecystic inflammation and acute cholecystitis is not excluded.  Left renal atrophy and right renal cysts are present.  There is no evidence of free fluid, enlarged lymph nodes, biliary dilation or abdominal aortic aneurysm.  Prior bowel surgery noted.  There  is no evidence of bowel obstruction, pneumoperitoneum or abscess.  The patient is status post hysterectomy.  The bladder is unremarkable.  No acute or suspicious bony abnormalities are present.  IMPRESSION: Cholelithiasis with possible mild pericholecystic inflammation. Acute cholecystitis is not excluded and consider further evaluation with ultrasound or nuclear medicine study.   Electronically Signed   By: Hassan Rowan M.D.   On: 11/11/2013 21:01    Anti-infectives: Anti-infectives   Start     Dose/Rate Route Frequency Ordered Stop   11/11/13 2200  Ampicillin-Sulbactam (UNASYN) 3 g in sodium chloride 0.9 % 100 mL IVPB     3 g 100 mL/hr over 60 Minutes Intravenous Every 6 hours 11/11/13 2149        Assessment/Plan: s/p Procedure(s): LAPAROSCOPIC CHOLECYSTECTOMY WITH INTRAOPERATIVE CHOLANGIOGRAM (N/A) For lap chole and possible open chole today.  Risks and benefits of surgery discussed with her today including some of the technical aspects and she understands and wishes to proceed  LOS: 1 day    TOTH III,PAUL S 11/12/2013

## 2013-11-12 NOTE — Progress Notes (Signed)
Patient is alert and oriented and vital signs are stable. Patient had a Lap Chole today and returned from surgery with complains of itching. Benadryl IV was given which helped relieve itching a little. Patient is on clear liquid diet and is tolerating it well with no complains of nausea or vomiting. Patient has liquid skin adhesive on abdomen which looks clean and intact. Patient has not voided after surgery, so continuous monitoring is required. Marlinda Mike (student nurse) 11/12/13. 19:00

## 2013-11-12 NOTE — Anesthesia Postprocedure Evaluation (Signed)
Anesthesia Post Note  Patient: Jacqueline Gamble  Procedure(s) Performed: Procedure(s) (LRB): LAPAROSCOPIC CHOLECYSTECTOMY WITH INTRAOPERATIVE CHOLANGIOGRAM (N/A)  Anesthesia type: General  Patient location: PACU  Post pain: Pain level controlled  Post assessment: Post-op Vital signs reviewed  Last Vitals: BP 168/76  Pulse 72  Temp(Src) 36.4 C (Oral)  Resp 14  Ht 5\' 4"  (1.626 m)  Wt 227 lb (102.967 kg)  BMI 38.95 kg/m2  SpO2 94%  Post vital signs: Reviewed  Level of consciousness: sedated  Complications: No apparent anesthesia complications

## 2013-11-12 NOTE — Progress Notes (Signed)
Reviewed and agree with management plan.  Fairy Ashlock T. Evann Erazo, MD FACG 

## 2013-11-12 NOTE — Anesthesia Preprocedure Evaluation (Signed)
Anesthesia Evaluation  Patient identified by MRN, date of birth, ID band Patient awake    Reviewed: Allergy & Precautions, H&P , NPO status , Patient's Chart, lab work & pertinent test results  Airway Mallampati: II TM Distance: >3 FB     Dental  (+) Dental Advisory Given   Pulmonary neg pulmonary ROS,  breath sounds clear to auscultation        Cardiovascular hypertension, Pt. on medications Rhythm:Regular Rate:Normal     Neuro/Psych negative neurological ROS  negative psych ROS   GI/Hepatic Neg liver ROS, GERD-  Medicated,  Endo/Other  Hypothyroidism Morbid obesity  Renal/GU negative Renal ROS     Musculoskeletal negative musculoskeletal ROS (+)   Abdominal (+) + obese,   Peds  Hematology negative hematology ROS (+)   Anesthesia Other Findings   Reproductive/Obstetrics negative OB ROS                           Anesthesia Physical Anesthesia Plan  ASA: III  Anesthesia Plan: General   Post-op Pain Management:    Induction: Intravenous  Airway Management Planned: Oral ETT  Additional Equipment:   Intra-op Plan:   Post-operative Plan: Extubation in OR  Informed Consent: I have reviewed the patients History and Physical, chart, labs and discussed the procedure including the risks, benefits and alternatives for the proposed anesthesia with the patient or authorized representative who has indicated his/her understanding and acceptance.   Dental advisory given  Plan Discussed with: CRNA  Anesthesia Plan Comments:         Anesthesia Quick Evaluation

## 2013-11-12 NOTE — Care Management Note (Signed)
    Page 1 of 1   11/12/2013     1:36:50 PM   CARE MANAGEMENT NOTE 11/12/2013  Patient:  Jacqueline Gamble, Jacqueline Gamble   Account Number:  [id number]  Date Initiated:  11/12/2013  Documentation initiated by:  Sunday Spillers  Subjective/Objective Assessment:   73 yo female admitted with acute cholecystitis. PTA lived at home with spouse.     Action/Plan:   Home when stable   Anticipated DC Date:  11/13/2013   Anticipated DC Plan:  [place]  CM consult      Choice offered to / List presented to:             Status of service:  Completed, signed off Medicare Important Message given?  NA - LOS <3 / Initial given by admissions (If response is "NO", the following Medicare IM given date fields will be blank) Date Medicare IM given:   Date Additional Medicare IM given:    Discharge Disposition:  HOME/SELF CARE  Per UR Regulation:  Reviewed for med. necessity/level of care/duration of stay  If discussed at [place] of Stay Meetings, dates discussed:    Comments:

## 2013-11-12 NOTE — Progress Notes (Signed)
PACU note-----pt c/o itching all over; mild rednesss seen at right arm; Dr. Lissa Hoard notified and med given

## 2013-11-12 NOTE — Progress Notes (Signed)
Requests for records from [facility] sent received H&P and CT report, no discharge summary available per records department.  Information placed on patient chart

## 2013-11-12 NOTE — Progress Notes (Signed)
11/12/2013 Jacqueline Gamble [id number] September 09, 1940   HISTORY OF PRESENT ILLNESS:  This is a 73 year old female who has a past medical history of hypertension, gout, colon cancer in the 80's, hyperlipidemia, and hypothyroidism. She is known to Dr. Fuller Plan for a colonoscopy in November 2013 at which time she was found to have diverticulosis, small internal hemorrhoids, and 2 tubular adenomas which were removed.  It was recommended that she have a repeat colonoscopy in 5 years.  She was placed on my schedule today as an urgent add-on for complaints of constipation and recent hospitalization at [facility]. The patient was supposed to bring her records from her hospitalization with her to her visit today, however, I only have a set of labs within her discharge summary stating that she had a diagnosis of small bowel obstuction due to adhesions. The patient tells me that approximately 12 days ago she started having a lot of abdominal pain with nausea and vomiting. She eventually went to her in the emergency department where she says that she had a workup including a CAT scan that showed a twist/blockage in her small intestine. She states that she's had a small bowel obstruction in the past. She was in the hospital for approximately 6 days and on her date of discharge she states that the surgeon reportedly wanted to perform surgery. She refused surgery and was reportedly discharged home then the same day. She continues to have nausea, but has not had any further vomiting. She states she is only been drinking liquids and has not eaten anything since her hospital discharge almost a week ago. She has been tolerating liquids. She states that after being discharged from the hospital she drank a bottle of magnesium citrate and had several dark stools, but has not had a bowel movement in the last several days. She is passing flatus. She comes in today doubled over in tearful complaining of continued abdominal  pain.   Past Medical History  Diagnosis Date  . Hypertension   . Gout   . Thyroid disease   . History of colon cancer 1980  . Colon polyp 2013    TUBULAR ADENOMA  . Hyperlipemia   . GERD (gastroesophageal reflux disease)   . Hypothyroidism   . Cancer    Past Surgical History  Procedure Laterality Date  . Colonoscopy  2013  . Colon surgery       2 times/1980 and 1983  . Total vaginal hysterectomy    . Appendectomy      reports that she has never smoked. She has never used smokeless tobacco. She reports that she does not drink alcohol or use illicit drugs. family history includes Colon cancer in her maternal grandfather; Colon polyps in her son. Allergies  Allergen Reactions  . Iodine Swelling  . Codeine Itching and Rash      Outpatient Encounter Prescriptions as of 11/11/2013  Medication Sig  . aliskiren (TEKTURNA) 150 MG tablet Take 150 mg by mouth daily.  Marland Kitchen allopurinol (ZYLOPRIM) 300 MG tablet Take 300 mg by mouth daily.  Marland Kitchen aspirin 81 MG tablet Take 81 mg by mouth daily.  Marland Kitchen docusate sodium (COLACE) 100 MG capsule Take 100 mg by mouth 2 (two) times daily.  . hydrALAZINE (APRESOLINE) 100 MG tablet Take 100 mg by mouth daily.  . Multiple Vitamins-Calcium (ONE-A-DAY WOMENS FORMULA PO) Take by mouth daily.  . pantoprazole (PROTONIX) 40 MG tablet Take 40 mg by mouth daily.  . pravastatin (PRAVACHOL) 40 MG tablet Take 40 mg  by mouth daily.  Marland Kitchen torsemide (DEMADEX) 20 MG tablet Take 20 mg by mouth daily.  . [DISCONTINUED] levothyroxine (SYNTHROID) 125 MCG tablet Take 125 mcg by mouth daily.     REVIEW OF SYSTEMS  : All other systems reviewed and negative except where noted in the History of Present Illness.   PHYSICAL EXAM: BP 168/100  Pulse 108  Ht 5\' 5"  (1.651 m)  Wt 227 lb (102.967 kg)  BMI 37.77 kg/m2 General: Well developed white female appears tearful and very uncomfortable Head: Normocephalic and atraumatic Eyes:  Sclerae anicteric, conjunctiva pink. Ears:  Normal auditory acuity Lungs: Clear throughout to auscultation Heart:  Tachy but regular rhythm. Abdomen: Soft, non-distended.  Normal bowel sounds.  Moderate TTP mostly on the right without R/R/G. Musculoskeletal: Symmetrical with no gross deformities  Skin: No lesions on visible extremities Extremities: No edema  Neurological: Alert oriented x 4, grossly non-focal Psychological:  Alert and cooperative. Normal mood and affect  ASSESSMENT AND PLAN: -73 year old female with history of colon cancer status post resection with complaints of abdominal pain and inability to eat. A recent hospitalization at different facility with diagnosis of small bowel obstruction.  Unfortunately I do not have any significant records to review. I do not have a high suspicion for bowel obstruction according to physical exam today, however, patient appears very uncomfortable. I discussed with the patient at length and gave her the option to either go to the emergency department here at [facility] for reimaging and evaluation versus outpatient CT scan and a workup tomorrow.  Patient elected to go to the emergency department.

## 2013-11-12 NOTE — Op Note (Signed)
11/11/2013 - 11/12/2013  1:16 PM  PATIENT:  Jacqueline Gamble  73 y.o. female  PRE-OPERATIVE DIAGNOSIS:  Cholelithiasis with cholecystitis  POST-OPERATIVE DIAGNOSIS:  Cholelithiasis with cholecystitis  PROCEDURE:  Procedure(s): LAPAROSCOPIC CHOLECYSTECTOMY WITH INTRAOPERATIVE CHOLANGIOGRAM (N/A)  SURGEON:  Surgeon(s) and Role:    * Merrie Roof, MD - Primary  PHYSICIAN ASSISTANT:   ASSISTANTS: none   ANESTHESIA:   general  EBL:  Total I/O In: 263.8 [I.V.:263.8] Out: 300 [Urine:300]  BLOOD ADMINISTERED:none  DRAINS: none   LOCAL MEDICATIONS USED:  MARCAINE     SPECIMEN:  Source of Specimen:  gallbladder  DISPOSITION OF SPECIMEN:  PATHOLOGY  COUNTS:  YES  TOURNIQUET:  * No tourniquets in log *  DICTATION: .Dragon Dictation  Procedure: After informed consent was obtained the patient was brought to the operating room and placed in the supine position on the operating room table. After adequate induction of general anesthesia the patient's abdomen was prepped with ChloraPrep allowed to dry and draped in usual sterile manner. The patient had had previous abdominal surgery with a long midline scar. A site was therefore chosen in the right upper quadrant for access and the abdominal cavity with a 5 mm Optiview port. This area was infiltrated with quarter percent Marcaine. A small stab incision was made with a 15 blade knife. A 5 mm Optiview port with camera was then used to bluntly dissect the layers of the abdominal wall until access was gained to the abdominal cavity. The abdomen was insufflated with carbon dioxide without difficulty. A laparoscope was inserted through the 5 mm port And the abdomen was inspected. There were loose bowel loops adherent to the midline abdominal wall. In order to avoid these loops of bowel to other sites were chosen in the right upper quadrant for placement of other 5 mm ports. These areas were infiltrated quarter percent Marcaine and small stab  incisions were made with the 15 blade knife. 5 mm ports were then placed bluntly through these incisions into the abdominal cavity under direct vision without difficulty. A 10 mm port was then placed in a similar fashion the epigastric region making sure to stay well away from the adherent loops of bowel.. A blunt grasper was placed through the lateralmost 5 mm port and used to grasp the dome of the gallbladder and elevated anteriorly and superiorly. Another blunt grasper was placed through the other 5 mm port and used to retract the body and neck of the gallbladder. A dissector was placed through the epigastric port and using the electrocautery the peritoneal reflection at the gallbladder neck was opened. Blunt dissection was then carried out in this area until the gallbladder neck-cystic duct junction was readily identified and a good window was created. A single clip was placed on the gallbladder neck. A small  ductotomy was made just below the clip with laparoscopic scissors. A 14-gauge Angiocath was then placed through the anterior abdominal wall under direct vision. A Reddick cholangiogram catheter was then placed through the Angiocath and flushed. The catheter was then placed in the cystic duct and anchored in place with a clip. A cholangiogram was obtained that showed no filling defects good emptying into the duodenum an adequate length on the cystic duct. The anchoring clip and catheters were then removed from the patient. 3 clips were placed proximally on the cystic duct and the duct was divided between the 2 sets of clips. Posterior to this the cystic artery was identified and again dissected bluntly in  a circumferential manner until a good window  was created. 2 clips were placed proximally and one distally on the artery and the artery was divided between the 2 sets of clips. Next a laparoscopic hook cautery device was used to separate the gallbladder from the liver bed. Prior to completely detaching the  gallbladder from the liver bed the liver bed was inspected and several small bleeding points were coagulated with the electrocautery until the area was completely hemostatic. The gallbladder was then detached the rest of it from the liver bed without difficulty. A laparoscopic bag was inserted through the epigastric port. The gallbladder was placed within the bag and the bag was sealed. The bag with the gallbladder was then removed with the Epigastric port without difficulty. The fascial defect At the epigastric port was then closed with A figure-of-eight 0 Vicryl stitch. The liver bed was inspected again and found to be hemostatic. The abdomen was irrigated with copious amounts of saline until the effluent was clear. The ports were then removed under direct vision without difficulty and were found to be hemostatic. The gas was allowed to escape. The skin incisions were all closed with interrupted 4-0 Monocryl subcuticular stitches. Dermabond dressings were applied. The patient tolerated the procedure well. At the end of the case all needle sponge and instrument counts were correct. The patient was then awakened and taken to recovery in stable condition  PLAN OF CARE: Admit for overnight observation  PATIENT DISPOSITION:  PACU - hemodynamically stable.   Delay start of Pharmacological VTE agent (>24hrs) due to surgical blood loss or risk of bleeding: yes

## 2013-11-12 NOTE — Preoperative (Signed)
Beta Blockers   Reason not to administer Beta Blockers:Not Applicable 

## 2013-11-13 ENCOUNTER — Encounter (HOSPITAL_COMMUNITY): Payer: Self-pay | Admitting: General Surgery

## 2013-11-13 DIAGNOSIS — M109 Gout, unspecified: Secondary | ICD-10-CM | POA: Diagnosis present

## 2013-11-13 MED ORDER — OXYCODONE HCL 5 MG PO TABS: 5.0000 mg | ORAL_TABLET | Freq: Four times a day (QID) | ORAL | Status: DC | PRN

## 2013-11-13 NOTE — Discharge Summary (Signed)
Physician Discharge Summary  Patient ID: ARMIYA CARIAS MRN: [id number] DOB/AGE: 22-Jan-1941 73 y.o.  Admit date: 11/11/2013 Discharge date: 11/13/2013  Admitting Diagnosis: Cholecysitis Abdominal pain Obesity Hx of colon cancer GERD HTN Gout  Discharge Diagnosis Patient Active Problem List   Diagnosis Date Noted  . Gout 11/13/2013  . Abdominal pain, unspecified site 11/12/2013  . Personal history of colonic polyps 11/11/2013  . Acute cholecystitis with chronic cholecystitis 11/11/2013  . Obesity (BMI 30-39.9) 11/11/2013  . History of colon cancer   . GERD (gastroesophageal reflux disease)   . Hypertension     Consultants None  Imaging: Dg Cholangiogram Operative  11/12/2013   CLINICAL DATA:  Symptomatic cholelithiasis.  EXAM: INTRAOPERATIVE CHOLANGIOGRAM  TECHNIQUE: Cholangiographic images from the C-arm fluoroscopic device were submitted for interpretation post-operatively. Please see the procedural report for the amount of contrast and the fluoroscopy time utilized.  COMPARISON:  None.  FINDINGS: Injected contrast shows no evidence of biliary dilatation or stones within the common duct. No evidence of common duct stricture or obstruction, with prompt emptying of contrast into the duodenum demonstrated.  IMPRESSION: Negative intraoperative cholangiogram.   Electronically Signed   By: Earle Gell M.D.   On: 11/12/2013 13:21   Ct Abdomen Pelvis W Contrast  11/11/2013   CLINICAL DATA:  73 year old female with abdominal and pelvic pain. History of colon cancer and prior small bowel obstructions.  EXAM: CT ABDOMEN AND PELVIS WITH CONTRAST  TECHNIQUE: Multidetector CT imaging of the abdomen and pelvis was performed using the standard protocol following bolus administration of intravenous contrast.  CONTRAST:  173mL OMNIPAQUE IOHEXOL 300 MG/ML  SOLN  COMPARISON:  None.  FINDINGS: The liver, spleen, adrenal glands, pancreas are unremarkable.  Cholelithiasis identify with possible mall  pericholecystic inflammation and acute cholecystitis is not excluded.  Left renal atrophy and right renal cysts are present.  There is no evidence of free fluid, enlarged lymph nodes, biliary dilation or abdominal aortic aneurysm.  Prior bowel surgery noted.  There is no evidence of bowel obstruction, pneumoperitoneum or abscess.  The patient is status post hysterectomy.  The bladder is unremarkable.  No acute or suspicious bony abnormalities are present.  IMPRESSION: Cholelithiasis with possible mild pericholecystic inflammation. Acute cholecystitis is not excluded and consider further evaluation with ultrasound or nuclear medicine study.   Electronically Signed   By: Hassan Rowan M.D.   On: 11/11/2013 21:01    Procedures Dr. Marlou Starks (11/13/13) - Laparoscopic Cholecystectomy with [facility] Course:  TANAY SKAJA is a 73 y.o. female who presented to [facility] with daily post prandial nausea and vomiting and abdominal pain for several weeks.  Pt had a distant hx of colon CA with resection by Dr. Lennie Hummer in 1980, with subsequent reoperation 4 years later for bowel obstruction.  She denied any problems since.  She had been followed by [facility] with endoscopies.  Last colonoscopy showed tubular adenomas in 2013.  She stated she has had a BM approx every other day with no change in caliber.  She denied family history IBD, IBS, allergy such as celiac sprue, dietary/dairy problems, colitis, ulcers, hematochezia, hematemesis, coffee ground emesis.or gastritis.  She denies any recent illness or sick contacts, as well as any travel outside of the country.  She also denied changes in diet, dysphagia of solids or liquids. She is on a daily PPI but denies any significant heartburn or reflex.  She was admitted in this time in [place], [place] for converns of bowel obstruction  with stay of 5 days.  She also saw GI and was advised to come to the ER.    Workup showed large gallstone and gallbladder with mild pericholecystic  fluid. Patient was admitted to med surg and underwent procedure listed above.  Tolerated procedure well and was transferred to the floor.  Diet was advanced as tolerated.  On POD #1, the patient was voiding well, tolerating diet, ambulating well, pain well controlled, vital signs stable, incisions c/d/i and felt stable for discharge home.  Patient will follow up in our office on 12/03/13 and knows to call with questions or concerns.  Physical Exam: General:  Alert, NAD, pleasant, comfortable Cor: Regular rate and rhythm.  No M/G/R appreciated. Lungs:  CTA bilaterally.  No W/R/R. Abd:  Soft, mild tenderness to RUQ, incisions C/D/I. Lap chole incisions sealed with dermabond.    Medication List    TAKE these medications       aliskiren 150 MG tablet  Commonly known as:  TEKTURNA  Take 150 mg by mouth daily.     allopurinol 300 MG tablet  Commonly known as:  ZYLOPRIM  Take 300 mg by mouth daily.     aspirin 81 MG tablet  Take 81 mg by mouth daily.     hydrALAZINE 100 MG tablet  Commonly known as:  APRESOLINE  Take 100 mg by mouth daily.     levothyroxine 75 MCG tablet  Commonly known as:  SYNTHROID, LEVOTHROID  Take 75 mcg by mouth daily before breakfast.     ONE-A-DAY WOMENS FORMULA PO  Take by mouth daily.     oxyCODONE 5 MG immediate release tablet  Commonly known as:  Oxy IR/ROXICODONE  Take 1-2 tablets (5-10 mg total) by mouth every 6 (six) hours as needed for moderate pain or severe pain.     pantoprazole 40 MG tablet  Commonly known as:  PROTONIX  Take 40 mg by mouth daily.     pravastatin 40 MG tablet  Commonly known as:  PRAVACHOL  Take 40 mg by mouth daily.     torsemide 20 MG tablet  Commonly known as:  DEMADEX  Take 20 mg by mouth daily.      ASK your doctor about these medications       docusate sodium 100 MG capsule  Commonly known as:  COLACE  Take 100 mg by mouth 2 (two) times daily.         Follow-up Information   Follow up with [facility] On 12/03/2013. (Your appointment is at 3 PM, be at the office for check in at 2:30 PM)    Contact information:   [address]   [place] [address] [phone]       Signed: Barrington Ellison, PA-S  [facility] [phone]  11/13/2013, 1:02 PM   ----------------------------------------------------------------------------------------------------------- General Surgery PA Preceptor Note:  I agree with the above PA students findings as above.  Made changes above as needed.  Coralie Keens, PA-C General Surgery [facility] Pager: [phone] Office: [phone]

## 2013-11-13 NOTE — Progress Notes (Signed)
Patient is alert and oriented and vital signs are stable. Patient ambulated in hall way around nursing station several times and tolerated it very well. Patient voids well and had a bowel movement. Patient states does not have any pain. Patient tolerated heart healthy diet with no complains of nausea or vomiting. Marlinda Mike (student nurse) 11/13/2013. 14:54pm

## 2013-11-24 NOTE — ED Provider Notes (Signed)
CSN: [id number]     Arrival date & time 11/11/13  1607 History   First MD Initiated Contact with Patient 11/11/13 1808     Chief Complaint  Patient presents with  . Abdominal Pain      HPI Pt states she was hosptitalized im [facility] on Thursday for small bowel obstruction. Pt states she has had pain with eating since then. No n/v/d pt states lower abd hurts with eating. Pt states she has had several inches of small intestine removed due to SBO, and several inches of large intestine removed due to colon ca several years ago.  Past Medical History  Diagnosis Date  . Hypertension   . Gout   . Thyroid disease   . History of colon cancer 1980  . Colon polyp 2013    TUBULAR ADENOMA  . Hyperlipemia   . GERD (gastroesophageal reflux disease)   . Hypothyroidism   . Cancer    Past Surgical History  Procedure Laterality Date  . Colonoscopy  2013  . Colon surgery       2 times/1980 and 1983  . Total vaginal hysterectomy    . Appendectomy    . Cholecystectomy N/A 11/12/2013    Procedure: LAPAROSCOPIC CHOLECYSTECTOMY WITH INTRAOPERATIVE CHOLANGIOGRAM;  Surgeon: Merrie Roof, MD;  Location: [facility] ORS;  Service: General;  Laterality: N/A;   Family History  Problem Relation Age of Onset  . Colon cancer Maternal Grandfather   . Colon polyps Son    History  Substance Use Topics  . Smoking status: Never Smoker   . Smokeless tobacco: Never Used  . Alcohol Use: No   OB History   Grav Para Term Preterm Abortions TAB SAB Ect Mult Living                 Review of Systems  All other systems reviewed and are negative.      Allergies  Iodine and Codeine  Home Medications   Current Outpatient Rx  Name  Route  Sig  Dispense  Refill  . aliskiren (TEKTURNA) 150 MG tablet   Oral   Take 150 mg by mouth daily.         Marland Kitchen allopurinol (ZYLOPRIM) 300 MG tablet   Oral   Take 300 mg by mouth daily.         Marland Kitchen aspirin 81 MG tablet   Oral   Take 81 mg by mouth daily.        Marland Kitchen docusate sodium (COLACE) 100 MG capsule   Oral   Take 100 mg by mouth 2 (two) times daily.         . hydrALAZINE (APRESOLINE) 100 MG tablet   Oral   Take 100 mg by mouth daily.         Marland Kitchen levothyroxine (SYNTHROID, LEVOTHROID) 75 MCG tablet   Oral   Take 75 mcg by mouth daily before breakfast.         . Multiple Vitamins-Calcium (ONE-A-DAY WOMENS FORMULA PO)   Oral   Take by mouth daily.         . pantoprazole (PROTONIX) 40 MG tablet   Oral   Take 40 mg by mouth daily.         . pravastatin (PRAVACHOL) 40 MG tablet   Oral   Take 40 mg by mouth daily.         Marland Kitchen torsemide (DEMADEX) 20 MG tablet   Oral   Take 20 mg by mouth daily.         Marland Kitchen  oxyCODONE (OXY IR/ROXICODONE) 5 MG immediate release tablet   Oral   Take 1-2 tablets (5-10 mg total) by mouth every 6 (six) hours as needed for moderate pain or severe pain.   40 tablet   0    BP 149/71  Pulse 81  Temp(Src) 98.4 F (36.9 C) (Oral)  Resp 16  Ht 5\' 4"  (1.626 m)  Wt 230 lb 6.4 oz (104.509 kg)  BMI 39.53 kg/m2  SpO2 98% Physical Exam  Nursing note and vitals reviewed. Constitutional: She is oriented to person, place, and time. She appears well-developed and well-nourished. No distress.  HENT:  Head: Normocephalic and atraumatic.  Eyes: Pupils are equal, round, and reactive to light.  Neck: Normal range of motion.  Cardiovascular: Normal rate and intact distal pulses.   Pulmonary/Chest: No respiratory distress.  Abdominal: Normal appearance. She exhibits no distension. There is tenderness in the right upper quadrant. There is positive Murphy's sign. There is no rigidity, no rebound and no guarding.  Musculoskeletal: Normal range of motion.  Neurological: She is alert and oriented to person, place, and time. No cranial nerve deficit.  Skin: Skin is warm and dry. No rash noted.  Psychiatric: She has a normal mood and affect. Her behavior is normal.    ED Course  Procedures (including  critical care time) Labs Review Labs Reviewed  COMPREHENSIVE METABOLIC PANEL - Abnormal; Notable for the following:    Glucose, Bld 111 (*)    GFR calc non Af Amer 53 (*)    GFR calc Af Amer 62 (*)    All other components within normal limits  CBC WITH DIFFERENTIAL - Abnormal; Notable for the following:    RDW 16.0 (*)    All other components within normal limits  SURGICAL PCR SCREEN  LIPASE, BLOOD  SURGICAL PATHOLOGY   Imaging Review IMPRESSION: of CT Abdomen: Cholelithiasis with possible mild pericholecystic inflammation. Acute cholecystitis is not excluded and consider further evaluation with ultrasound or nuclear medicine study.  Discussed with surgery who will evaluate for possible admission.    MDM   Final diagnoses:  History of colon cancer  Colon polyp  GERD (gastroesophageal reflux disease)  Hypertension        Dot Lanes, MD 11/24/13 1640

## 2013-12-03 ENCOUNTER — Ambulatory Visit (INDEPENDENT_AMBULATORY_CARE_PROVIDER_SITE_OTHER): Payer: Medicare Other | Admitting: General Surgery

## 2013-12-03 ENCOUNTER — Encounter (INDEPENDENT_AMBULATORY_CARE_PROVIDER_SITE_OTHER): Payer: Self-pay | Admitting: General Surgery

## 2013-12-03 VITALS — BP 150/85 | HR 88 | Temp 96.7°F | Resp 16 | Ht 64.0 in | Wt 224.6 lb

## 2013-12-03 DIAGNOSIS — K8010 Calculus of gallbladder with chronic cholecystitis without obstruction: Secondary | ICD-10-CM

## 2013-12-03 NOTE — Patient Instructions (Signed)
Call us if your incisions give you any problems. If your stomach continues to bother you contact Galen Manila, MD

## 2013-12-03 NOTE — Progress Notes (Signed)
[name] 03-04-1941 [id number] 12/03/2013   Jacqueline Gamble is a 73 y.o. female who had a laparoscopic cholecystectomy with intraoperative cholangiogram by Dr. Dr. Autumn Messing.  The pathology report confirmed Chronic cholecystitis and cholelithiasis.  The patient reports that they are feeling well with normal bowel movements and good appetite.  The pre-operative symptoms of abdominal pain, nausea, and vomiting have resolved.    Physical examination - BP 150/85  Pulse 88  Temp(Src) 96.7 F (35.9 C)  Resp 16  Ht 5\' 4"  (1.626 m)  Wt 101.878 kg (224 lb 9.6 oz)  BMI 38.53 kg/m2  Incisions appear well-healed with no sign of infection or bleeding.   Abdomen - soft, non-tender  Impression:  s/p laparoscopic cholecystectomy  Plan:  She may resume a regular diet and full activity.  She may follow-up on a PRN basis.

## 2014-03-22 DEATH — deceased

## 2014-04-24 ENCOUNTER — Encounter: Payer: Self-pay | Admitting: Gastroenterology

## 2015-10-29 DIAGNOSIS — K922 Gastrointestinal hemorrhage, unspecified: Secondary | ICD-10-CM | POA: Insufficient documentation

## 2016-04-28 ENCOUNTER — Emergency Department (HOSPITAL_COMMUNITY): Payer: Medicare Other

## 2016-04-28 ENCOUNTER — Encounter (HOSPITAL_COMMUNITY): Payer: Self-pay

## 2016-04-28 ENCOUNTER — Emergency Department (HOSPITAL_COMMUNITY)
Admission: EM | Admit: 2016-04-28 | Discharge: 2016-04-28 | Disposition: A | Discharge status: Home / Self Care | Payer: Medicare Other | Attending: Emergency Medicine | Admitting: Emergency Medicine

## 2016-04-28 DIAGNOSIS — E039 Hypothyroidism, unspecified: Secondary | ICD-10-CM | POA: Diagnosis not present

## 2016-04-28 DIAGNOSIS — I10 Essential (primary) hypertension: Secondary | ICD-10-CM | POA: Insufficient documentation

## 2016-04-28 DIAGNOSIS — R300 Dysuria: Secondary | ICD-10-CM | POA: Diagnosis not present

## 2016-04-28 DIAGNOSIS — R1111 Vomiting without nausea: Secondary | ICD-10-CM | POA: Insufficient documentation

## 2016-04-28 DIAGNOSIS — Z79899 Other long term (current) drug therapy: Secondary | ICD-10-CM | POA: Insufficient documentation

## 2016-04-28 DIAGNOSIS — Z7982 Long term (current) use of aspirin: Secondary | ICD-10-CM | POA: Insufficient documentation

## 2016-04-28 DIAGNOSIS — Z85038 Personal history of other malignant neoplasm of large intestine: Secondary | ICD-10-CM | POA: Insufficient documentation

## 2016-04-28 DIAGNOSIS — R1030 Lower abdominal pain, unspecified: Secondary | ICD-10-CM

## 2016-04-28 DIAGNOSIS — R109 Unspecified abdominal pain: Secondary | ICD-10-CM | POA: Diagnosis present

## 2016-04-28 LAB — CBC
HCT: 44.2 % (ref 36.0–46.0)
Hemoglobin: 14.1 g/dL (ref 12.0–15.0)
MCH: 29.8 pg (ref 26.0–34.0)
MCHC: 31.9 g/dL (ref 30.0–36.0)
MCV: 93.4 fL (ref 78.0–100.0)
Platelets: 336 10*3/uL (ref 150–400)
RBC: 4.73 MIL/uL (ref 3.87–5.11)
RDW: 15.4 % (ref 11.5–15.5)
WBC: 7.3 10*3/uL (ref 4.0–10.5)

## 2016-04-28 LAB — COMPREHENSIVE METABOLIC PANEL
ALT: 72 U/L — ABNORMAL HIGH (ref 14–54)
AST: 38 U/L (ref 15–41)
Albumin: 3.9 g/dL (ref 3.5–5.0)
Alkaline Phosphatase: 280 U/L — ABNORMAL HIGH (ref 38–126)
Anion gap: 10 (ref 5–15)
BUN: 19 mg/dL (ref 6–20)
CO2: 27 mmol/L (ref 22–32)
Calcium: 9.4 mg/dL (ref 8.9–10.3)
Chloride: 104 mmol/L (ref 101–111)
Creatinine, Ser: 1.07 mg/dL — ABNORMAL HIGH (ref 0.44–1.00)
GFR calc Af Amer: 57 mL/min — ABNORMAL LOW (ref >60)
GFR calc non Af Amer: 49 mL/min — ABNORMAL LOW (ref >60)
Glucose, Bld: 140 mg/dL — ABNORMAL HIGH (ref 65–99)
Potassium: 3.4 mmol/L — ABNORMAL LOW (ref 3.5–5.1)
Sodium: 141 mmol/L (ref 135–145)
Total Bilirubin: 0.9 mg/dL (ref 0.3–1.2)
Total Protein: 7.9 g/dL (ref 6.5–8.1)

## 2016-04-28 LAB — URINALYSIS, ROUTINE W REFLEX MICROSCOPIC
Bilirubin Urine: NEGATIVE
Glucose, UA: NEGATIVE mg/dL
Ketones, ur: NEGATIVE mg/dL
Nitrite: NEGATIVE
Protein, ur: NEGATIVE mg/dL
Specific Gravity, Urine: 1.009 (ref 1.005–1.030)
pH: 6 (ref 5.0–8.0)

## 2016-04-28 LAB — URINE MICROSCOPIC-ADD ON

## 2016-04-28 LAB — LIPASE, BLOOD: Lipase: 21 U/L (ref 11–51)

## 2016-04-28 MED ORDER — ONDANSETRON HCL 4 MG PO TABS: 4.0000 mg | ORAL_TABLET | Freq: Four times a day (QID) | ORAL | 0 refills | Status: DC

## 2016-04-28 MED ORDER — DICYCLOMINE HCL 10 MG PO CAPS
10.0000 mg | ORAL_CAPSULE | Freq: Once | ORAL | Status: AC
  Administered 2016-04-28: 10 mg via ORAL
  Filled 2016-04-28: qty 1

## 2016-04-28 MED ORDER — SODIUM CHLORIDE 0.9 % IV BOLUS (SEPSIS)
500.0000 mL | Freq: Once | INTRAVENOUS | Status: AC
  Administered 2016-04-28: 500 mL via INTRAVENOUS

## 2016-04-28 MED ORDER — TRAMADOL HCL 50 MG PO TABS: 50.0000 mg | ORAL_TABLET | Freq: Four times a day (QID) | ORAL | 0 refills | Status: DC | PRN

## 2016-04-28 MED ORDER — ONDANSETRON HCL 4 MG/2ML IJ SOLN
4.0000 mg | Freq: Once | INTRAMUSCULAR | Status: AC
  Administered 2016-04-28: 4 mg via INTRAVENOUS
  Filled 2016-04-28: qty 2

## 2016-04-28 MED ORDER — HYDROMORPHONE HCL 1 MG/ML IJ SOLN
0.5000 mg | Freq: Once | INTRAMUSCULAR | Status: AC
  Administered 2016-04-28: 0.5 mg via INTRAVENOUS
  Filled 2016-04-28: qty 1

## 2016-04-28 NOTE — ED Triage Notes (Signed)
Pt states abdominal pain and n/v for "months".  Pt has not seen MD.  Pt denies fever and diarrhea.  No bm x 3 days.  Increased urination only at night.  No burning,.

## 2016-04-28 NOTE — ED Provider Notes (Signed)
[facility] DEPT Provider Note   CSN: [id number] Arrival date & time: 04/28/16  1007  By signing my name below, I, Sonum Patel, attest that this documentation has been prepared under the direction and in the presence of Virgel Manifold, MD. Electronically Signed: Sonum Patel, Education administrator. 04/28/16. 12:27 PM.  History   Chief Complaint Chief Complaint  Patient presents with  . Abdominal Pain  . Emesis    The history is provided by the patient. No language interpreter was used.     HPI Comments: Jacqueline Gamble is a 75 y.o. female who presents to the Emergency Department complaining of intermittent, unchanged right sided abdominal pain that has been ongoing for the past 2-3 months. She states the pain is aggravated by eating and alleviated after BM's. She reports having associated episodes of vomiting nearly each time she consumes food along with abdominal bloating and dysuria. She has been seen by her PCP but has not been diagnosed with anything. She reports a history of colon cancer, cholecystectomy, GERD, gastric ulcers.    Past Medical History:  Diagnosis Date  . Cancer ([place])   . Colon polyp 2013   TUBULAR ADENOMA  . GERD (gastroesophageal reflux disease)   . Gout   . History of colon cancer 1980  . Hyperlipemia   . Hypertension   . Hypothyroidism   . Thyroid disease     Patient Active Problem List   Diagnosis Date Noted  . Gout 11/13/2013  . Abdominal pain, unspecified site 11/12/2013  . Personal history of colonic polyps 11/11/2013  . Acute cholecystitis with chronic cholecystitis 11/11/2013  . Obesity (BMI 30-39.9) 11/11/2013  . History of colon cancer   . GERD (gastroesophageal reflux disease)   . Hypertension     Past Surgical History:  Procedure Laterality Date  . ABDOMINAL HYSTERECTOMY    . APPENDECTOMY    . CHOLECYSTECTOMY N/A 11/12/2013   Procedure: LAPAROSCOPIC CHOLECYSTECTOMY WITH INTRAOPERATIVE CHOLANGIOGRAM;  Surgeon: Merrie Roof, MD;  Location: [facility]  ORS;  Service: General;  Laterality: N/A;  . COLON SURGERY      2 times/1980 and 1983  . COLONOSCOPY  2013  . TOTAL VAGINAL HYSTERECTOMY      OB History    No data available       Home Medications    Prior to Admission medications   Medication Sig Start Date End Date Taking? Authorizing Provider  allopurinol (ZYLOPRIM) 300 MG tablet Take 300 mg by mouth daily.   Yes Historical Provider, MD  aspirin 81 MG tablet Take 81 mg by mouth daily.   Yes Historical Provider, MD  levothyroxine (SYNTHROID, LEVOTHROID) 125 MCG tablet Take 62.5 mcg by mouth daily before breakfast.   Yes Historical Provider, MD  pantoprazole (PROTONIX) 40 MG tablet Take 40 mg by mouth at bedtime.    Yes Historical Provider, MD  torsemide (DEMADEX) 20 MG tablet Take 20 mg by mouth daily.   Yes Historical Provider, MD  valsartan (DIOVAN) 320 MG tablet Take 320 mg by mouth daily. 02/25/16  Yes Historical Provider, MD  verapamil (VERELAN PM) 120 MG 24 hr capsule Take 120 mg by mouth at bedtime.   Yes Historical Provider, MD    Family History Family History  Problem Relation Age of Onset  . Colon cancer Maternal Grandfather   . Colon polyps Son     Social History Social History  Substance Use Topics  . Smoking status: Never Smoker  . Smokeless tobacco: Never Used  . Alcohol use No  Allergies   Iodine and Codeine   Review of Systems Review of Systems  Gastrointestinal: Positive for abdominal distention, abdominal pain and vomiting.  Genitourinary: Positive for dysuria.  All other systems reviewed and are negative.    Physical Exam Updated Vital Signs BP 174/89 (BP Location: Right Arm)   Pulse 84   Temp 98.2 F (36.8 C) (Oral)   Resp 18   SpO2 99%   Physical Exam  Constitutional: She is oriented to person, place, and time. She appears well-developed and well-nourished. No distress.  HENT:  Head: Normocephalic and atraumatic.  Eyes: EOM are normal.  Neck: Normal range of motion.    Cardiovascular: Normal rate, regular rhythm and normal heart sounds.   Pulmonary/Chest: Effort normal and breath sounds normal.  Abdominal: Soft. She exhibits no distension. There is tenderness (diffuse tenderness across lower abdomen).  Musculoskeletal: Normal range of motion.  Neurological: She is alert and oriented to person, place, and time.  Skin: Skin is warm and dry.  Psychiatric: She has a normal mood and affect. Judgment normal.  Nursing note and vitals reviewed.    ED Treatments / Results  DIAGNOSTIC STUDIES: Oxygen Saturation is 99% on RA, normal by my interpretation.    COORDINATION OF CARE: 12:30 PM Discussed treatment plan with pt at bedside and pt agreed to plan.    Labs (all labs ordered are listed, but only abnormal results are displayed) Labs Reviewed  COMPREHENSIVE METABOLIC PANEL - Abnormal; Notable for the following:       Result Value   Potassium 3.4 (*)    Glucose, Bld 140 (*)    Creatinine, Ser 1.07 (*)    ALT 72 (*)    Alkaline Phosphatase 280 (*)    GFR calc non Af Amer 49 (*)    GFR calc Af Amer 57 (*)    All other components within normal limits  URINALYSIS, ROUTINE W REFLEX MICROSCOPIC (NOT AT [facility]) - Abnormal; Notable for the following:    APPearance CLOUDY (*)    Hgb urine dipstick TRACE (*)    Leukocytes, UA TRACE (*)    All other components within normal limits  URINE MICROSCOPIC-ADD ON - Abnormal; Notable for the following:    Squamous Epithelial / LPF 0-5 (*)    Bacteria, UA RARE (*)    All other components within normal limits  LIPASE, BLOOD  CBC    EKG  EKG Interpretation None       Radiology Ct Abdomen Pelvis Wo Contrast  Result Date: 04/28/2016 CLINICAL DATA:  Lower abdominal pain for 3 months, associated with vomiting with food consumption. Dysuria. History of colon carcinoma. EXAM: CT ABDOMEN AND PELVIS WITHOUT CONTRAST TECHNIQUE: Multidetector CT imaging of the abdomen and pelvis was performed following the standard  protocol without IV contrast. COMPARISON:  November 11, 2013 FINDINGS: Lower chest: Lung bases are clear. There are scattered foci of coronary artery calcification. There is a hiatal hernia. Hepatobiliary: No focal liver lesions are identified on this noncontrast enhanced study. Gallbladder is absent. There is no appreciable biliary duct dilatation. Pancreas: No pancreatic mass or inflammatory focus. Spleen: No splenic lesions are evident. Adrenals/Urinary Tract: Adrenals appear normal. Left kidney is somewhat small compared to the right side, a stable finding. There is a cyst arising from the upper pole of the right kidney measuring 5.8 x 5.2 cm. There is a cyst arising from the lower pole right kidney posteriorly measuring 2.0 x 1.7 cm. There is no hydronephrosis on either side. There is  no renal or ureteral calculus on either side. Urinary bladder is midline with wall thickness within normal limits. Stomach/Bowel: Patient has had previous surgery involving the proximal sigmoid colon with patent anastomosis. This appearance is stable compared to prior study. At the suture line, a focal diverticulum is noted without complicating features. There is no appreciable bowel wall or mesenteric thickening. There is no evident bowel obstruction. No free air or portal venous air. There is a sizable diverticulum at the junction of the second and third portions of the duodenum measuring 5.6 x 4.3 cm. Vascular/Lymphatic: There are scattered foci of atherosclerotic calcification in the aorta. There is no abdominal aortic aneurysm. The major mesenteric vessels appear patent on this noncontrast enhanced study. There is no adenopathy in the abdomen or pelvis. Reproductive: Uterus is absent. There is no pelvic mass or pelvic fluid collection. Other: Appendix is absent. There is no ascites or abscess in the abdomen or pelvis. Musculoskeletal: Surgical clips in the anterior right pelvic wall are stable. No other abdominal wall lesions  are evident. There are multiple foci of degenerative change in the lumbar spine. There are no blastic or lytic bone lesions. 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 No blastic or lytic bone lesions are evident. No intramuscular lesions are evident. IMPRESSION: No bowel wall thickening or bowel obstruction. 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 There is also a diverticulum at a suture line in the proximal sigmoid colon region without complicating features. There is no abscess. Left kidney small compared to right kidney, a stable finding. Question a degree of renal artery stenosis. In this regard, question whether patient is hypertensive. No renal or ureteral calculi.  No hydronephrosis. Gallbladder, appendix, and uterus are absent. 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 Electronically Signed   By: Lowella Grip III M.D.   On: 04/28/2016 13:53    Procedures Procedures (including critical care time)  Medications Ordered in ED Medications - No data to display   Initial Impression / Assessment and Plan / ED Course  I have reviewed the triage vital signs and the nursing notes.  Pertinent labs & imaging results that were available during my care of the patient were reviewed by me and considered in my medical decision making (see chart for details).  Clinical Course    75 year old female with abdominal pain. Unclear etiology. It has been going on for several months. Workup fairly unremarkable including CT abdomen and pelvis. Low suspicion for emergent process. We'll treat her symptomatically. She does have established GI care. She reports that she is almost due for colonoscopy anyway. It may be prudent for her to schedule appointment sooner. Return precautions were discussed.  Final Clinical Impressions(s) / ED Diagnoses   Final diagnoses:  Lower abdominal pain    New Prescriptions New Prescriptions     No medications on file   I personally preformed the services scribed in my presence. The recorded information has been reviewed is accurate. Virgel Manifold, MD.    Virgel Manifold, MD 04/28/16 [id number]

## 2016-05-03 ENCOUNTER — Other Ambulatory Visit (INDEPENDENT_AMBULATORY_CARE_PROVIDER_SITE_OTHER): Payer: Medicare Other

## 2016-05-03 ENCOUNTER — Encounter: Payer: Self-pay | Admitting: Gastroenterology

## 2016-05-03 ENCOUNTER — Ambulatory Visit (INDEPENDENT_AMBULATORY_CARE_PROVIDER_SITE_OTHER): Payer: Medicare Other | Admitting: Gastroenterology

## 2016-05-03 VITALS — BP 150/100 | HR 72 | Ht 64.0 in | Wt 224.0 lb

## 2016-05-03 DIAGNOSIS — R7989 Other specified abnormal findings of blood chemistry: Secondary | ICD-10-CM | POA: Diagnosis not present

## 2016-05-03 DIAGNOSIS — K5900 Constipation, unspecified: Secondary | ICD-10-CM

## 2016-05-03 DIAGNOSIS — R109 Unspecified abdominal pain: Secondary | ICD-10-CM

## 2016-05-03 DIAGNOSIS — R112 Nausea with vomiting, unspecified: Secondary | ICD-10-CM | POA: Diagnosis not present

## 2016-05-03 DIAGNOSIS — R945 Abnormal results of liver function studies: Secondary | ICD-10-CM

## 2016-05-03 LAB — HEPATIC FUNCTION PANEL
ALT: 33 U/L (ref 0–35)
AST: 16 U/L (ref 0–37)
Albumin: 4.1 g/dL (ref 3.5–5.2)
Alkaline Phosphatase: 231 U/L — ABNORMAL HIGH (ref 39–117)
BILIRUBIN TOTAL: 0.6 mg/dL (ref 0.2–1.2)
Bilirubin, Direct: 0.2 mg/dL (ref 0.0–0.3)
Total Protein: 7.5 g/dL (ref 6.0–8.3)

## 2016-05-03 MED ORDER — PANTOPRAZOLE SODIUM 40 MG PO TBEC: 40.0000 mg | DELAYED_RELEASE_TABLET | Freq: Every day | ORAL | 5 refills | Status: DC

## 2016-05-03 NOTE — Progress Notes (Signed)
History of Present Illness: This is a 75 year old female referred by the ED for the evaluation of constipation, right-sided abdominal pain, abdominal bloating, vomiting and elevated LFTs. She relates worsening problems with constipation over several months. She states she's tried taking MiraLAX which is only partially helped her symptoms and occasionally it makes her vomit so she doesn't use it regularly. She has intermittent episodes of vomiting with solid foods and with liquids for about 2 months. These symptoms do not necessarily correlate with her right-sided pain which seems to worsen as her constipation worsens and it relieved with a bowel movement. She was evaluated in the ED on 9/7. Alkaline phosphatase=280, ALT=72. K=3.4, glucose=140 otherwise normal. CT as below. She states she was taking 3 Aleve per day for a few months but discontinued when her nausea and vomiting started  Abdomen/pelvic CT without IV contrast 04/28/2016 IMPRESSION:  No bowel wall thickening or bowel obstruction. 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 There is also a diverticulum at a suture line in the proximal sigmoid colon region without complicating features. There is no abscess. Left kidney small compared to right kidney, a stable finding. Question a degree of renal artery stenosis. In this regard, question whether patient is hypertensive. No renal or ureteral calculi.  No hydronephrosis. Gallbladder, appendix, and uterus are absent. 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  Review of Systems: Pertinent positive and negative review of systems were noted in the above HPI section. All other review of systems were otherwise negative.  Current Medications, Allergies, Past Medical History, Past Surgical History, Family History and Social History were reviewed in [employer] record.  Physical Exam: General: Well developed, well nourished, no acute distress Head: Normocephalic  and atraumatic Eyes:  sclerae anicteric, EOMI Ears: Normal auditory acuity Mouth: No deformity or lesions Neck: Supple, no masses or thyromegaly Lungs: Clear throughout to auscultation Heart: Regular rate and rhythm; no murmurs, rubs or bruits Abdomen: Soft, mild right sided tenderness and non distended. No masses, hepatosplenomegaly or hernias noted. Normal Bowel sounds Rectal: deferred to colonoscopy  Musculoskeletal: Symmetrical with no gross deformities  Skin: No lesions on visible extremities Pulses:  Normal pulses noted Extremities: No clubbing, cyanosis, edema or deformities noted Neurological: Alert oriented x 4, grossly nonfocal Cervical Nodes:  No significant cervical adenopathy Inguinal Nodes: No significant inguinal adenopathy Psychological:  Alert and cooperative. Normal mood and affect  Assessment and Recommendations:  1. Right sided abdominal pain, abdominal bloating likely related to constipation. As she is not tolerating MiraLAX will begin MOM 3 tablespoons daily for the next several days until she begins to have adequate bowel movements then reduce to once or twice daily to maintain adequate bowel movements. Consider Linzess or Trulance if MOM not effective.   2. Nausea and vomiting. Rule out ulcer, gastritis, partial gastric outlet obstruction, CBD stone or other biliary process. Discontinue all aspirin and NSAIDs. Increase pantoprazole to 40 mg twice daily. Continue Phenergan prn. Schedule EGD. The risks (including bleeding, perforation, infection, missed lesions, medication reactions and possible hospitalization or surgery if complications occur), benefits, and alternatives to endoscopy with possible biopsy and possible dilation were discussed with the patient and they consent to proceed.   3. Elevated LFTs. R/O biliary and hepatic causes. CT was noncontrasted as she has an iodine allergy. Repeat LFTs today. Consider MRI/MRCP if LFTs remain elevated.   4. Personal  history of colon cancer in 1986 and personal history of adenomatous colon polyps. Five-year interval colonoscopy is recommended in  November 2018.

## 2016-05-03 NOTE — Patient Instructions (Signed)
Your physician has requested that you go to the basement for the following lab work before leaving today:LFT's.   Start milk of magnesia 1 tablespoon three times a day until your bowels are more regular and take it daily for a maintenance dose.   We have sent the following medications to your pharmacy for you to pick up at your convenience: Protonix twice a day.  You have been scheduled for an endoscopy. Please follow written instructions given to you at your visit today. If you use inhalers (even only as needed), please bring them with you on the day of your procedure. Your physician has requested that you go to www.startemmi.com and enter the access code given to you at your visit today. This web site gives a general overview about your procedure. However, you should still follow specific instructions given to you by our office regarding your preparation for the procedure.  Normal BMI (Body Mass Index- based on height and weight) is between 23 and 30. Your BMI today is Body mass index is 38.45 kg/m. Marland Kitchen Please consider follow up  regarding your BMI with your Primary Care Provider.  Thank you for choosing me and [facility].  Pricilla Riffle. Dagoberto Ligas., MD., Marval Regal

## 2016-05-04 ENCOUNTER — Other Ambulatory Visit: Payer: Self-pay

## 2016-05-04 ENCOUNTER — Telehealth: Payer: Self-pay | Admitting: Gastroenterology

## 2016-05-04 DIAGNOSIS — R748 Abnormal levels of other serum enzymes: Secondary | ICD-10-CM

## 2016-05-04 NOTE — Telephone Encounter (Signed)
See phone notes for additional details

## 2016-05-10 ENCOUNTER — Ambulatory Visit (AMBULATORY_SURGERY_CENTER): Payer: Medicare Other | Admitting: Gastroenterology

## 2016-05-10 ENCOUNTER — Encounter: Payer: Self-pay | Admitting: Gastroenterology

## 2016-05-10 VITALS — BP 134/83 | HR 91 | Temp 98.7°F | Resp 19 | Ht 64.0 in | Wt 224.0 lb

## 2016-05-10 DIAGNOSIS — R109 Unspecified abdominal pain: Secondary | ICD-10-CM | POA: Diagnosis not present

## 2016-05-10 DIAGNOSIS — K5900 Constipation, unspecified: Secondary | ICD-10-CM

## 2016-05-10 DIAGNOSIS — R112 Nausea with vomiting, unspecified: Secondary | ICD-10-CM | POA: Diagnosis present

## 2016-05-10 DIAGNOSIS — K449 Diaphragmatic hernia without obstruction or gangrene: Secondary | ICD-10-CM

## 2016-05-10 DIAGNOSIS — K3189 Other diseases of stomach and duodenum: Secondary | ICD-10-CM

## 2016-05-10 DIAGNOSIS — K5901 Slow transit constipation: Secondary | ICD-10-CM

## 2016-05-10 DIAGNOSIS — K2970 Gastritis, unspecified, without bleeding: Secondary | ICD-10-CM

## 2016-05-10 MED ORDER — LINACLOTIDE 145 MCG PO CAPS: 145.0000 ug | ORAL_CAPSULE | Freq: Every day | ORAL | 6 refills | Status: DC

## 2016-05-10 MED ORDER — SODIUM CHLORIDE 0.9 % IV SOLN: 500.0000 mL | INTRAVENOUS | Status: DC

## 2016-05-10 NOTE — Progress Notes (Signed)
To recovery, rewport to [employer], RN, VSS

## 2016-05-10 NOTE — Op Note (Signed)
[place] Patient Name: Jacqueline Gamble Procedure Date: 05/10/2016 8:34 AM MRN: [id number] Endoscopist: Ladene Artist , MD Age: 75 Referring MD:  Date of Birth: [birth year] Gender: Female Account #: [id number] Procedure:                Upper GI endoscopy Indications:              Abdominal pain in the right upper quadrant, Nausea                            with vomiting Medicines:                Monitored Anesthesia Care Procedure:                Pre-Anesthesia Assessment:                           - Prior to the procedure, a History and Physical                            was performed, and patient medications and                            allergies were reviewed. The patient's tolerance of                            previous anesthesia was also reviewed. The risks                            and benefits of the procedure and the sedation                            options and risks were discussed with the patient.                            All questions were answered, and informed consent                            was obtained. Prior Anticoagulants: The patient has                            taken no previous anticoagulant or antiplatelet                            agents. ASA Grade Assessment: III - A patient with                            severe systemic disease. After reviewing the risks                            and benefits, the patient was deemed in                            satisfactory condition to undergo the procedure.  After obtaining informed consent, the endoscope was                            passed under direct vision. Throughout the                            procedure, the patient's blood pressure, pulse, and                            oxygen saturations were monitored continuously. The                            Model [id number] scope was introduced                            through the mouth, and advanced to  the second part                            of duodenum. The upper GI endoscopy was                            accomplished without difficulty. The patient                            tolerated the procedure well. Scope In: Scope Out: Findings:                 The examined esophagus was normal.                           Diffuse mild inflammation characterized by erythema                            and granularity was found in the gastric body and                            in the gastric antrum. Biopsies were taken with a                            cold forceps for histology.                           A small hiatal hernia was present.                           The exam of the stomach was otherwise normal.                           The duodenal bulb and second portion of the                            duodenum were normal. Complications:            No immediate complications. Estimated Blood Loss:     Estimated blood loss was minimal. Impression:               -  Normal esophagus.                           - Gastritis. Biopsied.                           - Small hiatal hernia.                           - Normal duodenal bulb and second portion of the                            duodenum. Recommendation:           - Patient has a contact number available for                            emergencies. The signs and symptoms of potential                            delayed complications were discussed with the                            patient. Return to normal activities tomorrow.                            Written discharge instructions were provided to the                            patient.                           - Resume previous diet.                           - Continue present medications including                            pantoprazole 40 mg po bid.                           - Await pathology results.                           - No aspirin, ibuprofen, naproxen, or other                             non-steroidal anti-inflammatory drugs.                           - Linzess 145 mcg po daily, 6 months of refills Ladene Artist, MD 05/10/2016 8:52:42 AM This report has been signed electronically.

## 2016-05-10 NOTE — Patient Instructions (Signed)
Discharge instructions given. Handout on gastritis and a hiatal hernia. Resume previous medications. No aspirin ,ibuprofen,naproxen , or other non-steroidal anti-inflammatory drugs. YOU HAD AN ENDOSCOPIC PROCEDURE TODAY AT [facility]:   Refer to the procedure report that was given to you for any specific questions about what was found during the examination.  If the procedure report does not answer your questions, please call your gastroenterologist to clarify.  If you requested that your care partner not be given the details of your procedure findings, then the procedure report has been included in a sealed envelope for you to review at your convenience later.  YOU SHOULD EXPECT: Some feelings of bloating in the abdomen. Passage of more gas than usual.  Walking can help get rid of the air that was put into your GI tract during the procedure and reduce the bloating. If you had a lower endoscopy (such as a colonoscopy or flexible sigmoidoscopy) you may notice spotting of blood in your stool or on the toilet paper. If you underwent a bowel prep for your procedure, you may not have a normal bowel movement for a few days.  Please Note:  You might notice some irritation and congestion in your nose or some drainage.  This is from the oxygen used during your procedure.  There is no need for concern and it should clear up in a day or so.  SYMPTOMS TO REPORT IMMEDIATELY:     Following upper endoscopy (EGD)  Vomiting of blood or coffee ground material  New chest pain or pain under the shoulder blades  Painful or persistently difficult swallowing  New shortness of breath  Fever of 100F or higher  Black, tarry-looking stools  For urgent or emergent issues, a gastroenterologist can be reached at any hour by calling [phone].   DIET:  We do recommend a small meal at first, but then you may proceed to your regular diet.  Drink plenty of fluids but you should avoid alcoholic  beverages for 24 hours.  ACTIVITY:  You should plan to take it easy for the rest of today and you should NOT DRIVE or use heavy machinery until tomorrow (because of the sedation medicines used during the test).    FOLLOW UP: Our staff will call the number listed on your records the next business day following your procedure to check on you and address any questions or concerns that you may have regarding the information given to you following your procedure. If we do not reach you, we will leave a message.  However, if you are feeling well and you are not experiencing any problems, there is no need to return our call.  We will assume that you have returned to your regular daily activities without incident.  If any biopsies were taken you will be contacted by phone or by letter within the next 1-3 weeks.  Please call us at [phone] if you have not heard about the biopsies in 3 weeks.    SIGNATURES/CONFIDENTIALITY: You and/or your care partner have signed paperwork which will be entered into your electronic medical record.  These signatures attest to the fact that that the information above on your After Visit Summary has been reviewed and is understood.  Full responsibility of the confidentiality of this discharge information lies with you and/or your care-partner.

## 2016-05-10 NOTE — Progress Notes (Signed)
Called to room to assist during endoscopic procedure.  Patient ID and intended procedure confirmed with present staff. Received instructions for my participation in the procedure from the performing physician.  

## 2016-05-11 ENCOUNTER — Telehealth: Payer: Self-pay

## 2016-05-11 NOTE — Telephone Encounter (Signed)
  Follow up Call-  Call back number 05/10/2016  Post procedure Call Back phone  # [phone]  Permission to leave phone message Yes  Some recent data might be hidden     Patient questions:  Do you have a fever, pain , or abdominal swelling? No. Pain Score  0 *  Have you tolerated food without any problems? Yes.    Have you been able to return to your normal activities? Yes.    Do you have any questions about your discharge instructions: Diet   No. Medications  No. Follow up visit  No.  Do you have questions or concerns about your Care? No.  Actions: * If pain score is 4 or above: No action needed, pain <4.

## 2016-05-18 ENCOUNTER — Encounter: Payer: Self-pay | Admitting: Gastroenterology

## 2016-05-25 ENCOUNTER — Telehealth: Payer: Self-pay | Admitting: Gastroenterology

## 2016-05-25 DIAGNOSIS — R109 Unspecified abdominal pain: Secondary | ICD-10-CM

## 2016-05-25 DIAGNOSIS — R112 Nausea with vomiting, unspecified: Secondary | ICD-10-CM

## 2016-05-25 MED ORDER — PANTOPRAZOLE SODIUM 40 MG PO TBEC: 40.0000 mg | DELAYED_RELEASE_TABLET | Freq: Two times a day (BID) | ORAL | 5 refills | Status: DC

## 2016-05-25 NOTE — Telephone Encounter (Signed)
Patient reports continued abdominal pain and nausea.  She has not been taking pantoproazole as ordered at EGD.  She states her pharmacy told her we never sent it.  I have resent RX.  She will try this and will call back if her symptoms fail to improve

## 2016-06-02 ENCOUNTER — Other Ambulatory Visit (INDEPENDENT_AMBULATORY_CARE_PROVIDER_SITE_OTHER): Payer: Medicare Other

## 2016-06-02 DIAGNOSIS — R748 Abnormal levels of other serum enzymes: Secondary | ICD-10-CM

## 2016-06-02 LAB — HEPATIC FUNCTION PANEL
ALK PHOS: 141 U/L — AB (ref 39–117)
ALT: 14 U/L (ref 0–35)
AST: 13 U/L (ref 0–37)
Albumin: 3.6 g/dL (ref 3.5–5.2)
BILIRUBIN TOTAL: 0.5 mg/dL (ref 0.2–1.2)
Bilirubin, Direct: 0.1 mg/dL (ref 0.0–0.3)
Total Protein: 6.8 g/dL (ref 6.0–8.3)

## 2016-06-03 ENCOUNTER — Other Ambulatory Visit: Payer: Self-pay

## 2016-06-03 DIAGNOSIS — R748 Abnormal levels of other serum enzymes: Secondary | ICD-10-CM

## 2016-06-24 ENCOUNTER — Other Ambulatory Visit: Payer: Self-pay

## 2016-06-24 DIAGNOSIS — K449 Diaphragmatic hernia without obstruction or gangrene: Secondary | ICD-10-CM

## 2016-06-24 DIAGNOSIS — R109 Unspecified abdominal pain: Secondary | ICD-10-CM

## 2016-06-24 DIAGNOSIS — K5901 Slow transit constipation: Secondary | ICD-10-CM

## 2016-06-24 MED ORDER — LINACLOTIDE 145 MCG PO CAPS: 145.0000 ug | ORAL_CAPSULE | Freq: Every day | ORAL | 1 refills | Status: DC

## 2016-06-27 ENCOUNTER — Other Ambulatory Visit (INDEPENDENT_AMBULATORY_CARE_PROVIDER_SITE_OTHER): Payer: Medicare Other

## 2016-06-27 DIAGNOSIS — R748 Abnormal levels of other serum enzymes: Secondary | ICD-10-CM

## 2016-06-27 LAB — HEPATIC FUNCTION PANEL
ALBUMIN: 3.6 g/dL (ref 3.5–5.2)
ALT: 52 U/L — AB (ref 0–35)
AST: 24 U/L (ref 0–37)
Alkaline Phosphatase: 262 U/L — ABNORMAL HIGH (ref 39–117)
Bilirubin, Direct: 0.1 mg/dL (ref 0.0–0.3)
TOTAL PROTEIN: 6.3 g/dL (ref 6.0–8.3)
Total Bilirubin: 0.4 mg/dL (ref 0.2–1.2)

## 2016-06-28 ENCOUNTER — Other Ambulatory Visit: Payer: Self-pay

## 2016-06-28 DIAGNOSIS — R945 Abnormal results of liver function studies: Principal | ICD-10-CM

## 2016-06-28 DIAGNOSIS — R7989 Other specified abnormal findings of blood chemistry: Secondary | ICD-10-CM

## 2016-06-29 ENCOUNTER — Other Ambulatory Visit (INDEPENDENT_AMBULATORY_CARE_PROVIDER_SITE_OTHER): Payer: Medicare Other

## 2016-06-29 DIAGNOSIS — R7989 Other specified abnormal findings of blood chemistry: Secondary | ICD-10-CM | POA: Diagnosis not present

## 2016-06-29 LAB — CREATININE, SERUM: Creatinine, Ser: 0.98 mg/dL (ref 0.40–1.20)

## 2016-06-29 LAB — BUN: BUN: 14 mg/dL (ref 6–23)

## 2016-07-08 ENCOUNTER — Other Ambulatory Visit: Payer: Self-pay | Admitting: Gastroenterology

## 2016-07-08 DIAGNOSIS — R7989 Other specified abnormal findings of blood chemistry: Secondary | ICD-10-CM

## 2016-07-08 DIAGNOSIS — R945 Abnormal results of liver function studies: Principal | ICD-10-CM

## 2016-07-10 ENCOUNTER — Ambulatory Visit (HOSPITAL_COMMUNITY)
Admission: RE | Admit: 2016-07-10 | Discharge: 2016-07-10 | Disposition: A | Discharge status: Home / Self Care | Payer: Medicare Other | Source: Ambulatory Visit | Attending: Gastroenterology | Admitting: Gastroenterology

## 2016-07-10 DIAGNOSIS — K8050 Calculus of bile duct without cholangitis or cholecystitis without obstruction: Secondary | ICD-10-CM | POA: Insufficient documentation

## 2016-07-10 DIAGNOSIS — N289 Disorder of kidney and ureter, unspecified: Secondary | ICD-10-CM | POA: Insufficient documentation

## 2016-07-10 DIAGNOSIS — R7989 Other specified abnormal findings of blood chemistry: Secondary | ICD-10-CM | POA: Diagnosis present

## 2016-07-10 DIAGNOSIS — Z9049 Acquired absence of other specified parts of digestive tract: Secondary | ICD-10-CM | POA: Diagnosis not present

## 2016-07-10 DIAGNOSIS — K5710 Diverticulosis of small intestine without perforation or abscess without bleeding: Secondary | ICD-10-CM | POA: Diagnosis not present

## 2016-07-10 DIAGNOSIS — R945 Abnormal results of liver function studies: Secondary | ICD-10-CM

## 2016-07-10 MED ORDER — GADOBENATE DIMEGLUMINE 529 MG/ML IV SOLN
20.0000 mL | Freq: Once | INTRAVENOUS | Status: AC | PRN
  Administered 2016-07-10: 20 mL via INTRAVENOUS

## 2016-07-12 ENCOUNTER — Other Ambulatory Visit: Payer: Self-pay

## 2016-07-12 DIAGNOSIS — K8050 Calculus of bile duct without cholangitis or cholecystitis without obstruction: Secondary | ICD-10-CM

## 2016-07-18 ENCOUNTER — Other Ambulatory Visit: Payer: Self-pay

## 2016-07-18 DIAGNOSIS — R109 Unspecified abdominal pain: Secondary | ICD-10-CM

## 2016-07-18 DIAGNOSIS — R7301 Impaired fasting glucose: Secondary | ICD-10-CM | POA: Insufficient documentation

## 2016-07-18 DIAGNOSIS — K449 Diaphragmatic hernia without obstruction or gangrene: Secondary | ICD-10-CM

## 2016-07-18 DIAGNOSIS — K5901 Slow transit constipation: Secondary | ICD-10-CM

## 2016-07-18 MED ORDER — LINACLOTIDE 145 MCG PO CAPS: 145.0000 ug | ORAL_CAPSULE | Freq: Every day | ORAL | 1 refills | Status: DC

## 2016-07-27 ENCOUNTER — Encounter (HOSPITAL_COMMUNITY): Payer: Self-pay | Admitting: *Deleted

## 2016-07-27 NOTE — Progress Notes (Signed)
Pt denies SOB, chest pain, and being under the care of a cardiologist. Pt denies having a stress test, echo and cardiac cath. Pt stated that she is not taking Aspirin but was made aware to stop taking vitamins, fish oil and herbal medications. Do not take any NSAIDs ie: Ibuprofen, Advil, Naproxen, BC and Goody Powder or any medication containing Aspirin. Pt stated that some labs were recently drawn and she will bring print out. Pt verbalized understanding of all pre-op instructions.

## 2016-07-28 ENCOUNTER — Ambulatory Visit (HOSPITAL_COMMUNITY): Payer: Medicare Other | Admitting: Anesthesiology

## 2016-07-28 ENCOUNTER — Ambulatory Visit (HOSPITAL_COMMUNITY): Payer: Medicare Other

## 2016-07-28 ENCOUNTER — Encounter (HOSPITAL_COMMUNITY)
Admission: RE | Disposition: A | Discharge status: Home / Self Care | Payer: Self-pay | Source: Ambulatory Visit | Attending: Gastroenterology

## 2016-07-28 ENCOUNTER — Ambulatory Visit (HOSPITAL_COMMUNITY)
Admission: RE | Admit: 2016-07-28 | Discharge: 2016-07-28 | Disposition: A | Discharge status: Home / Self Care | Payer: Medicare Other | Source: Ambulatory Visit | Attending: Gastroenterology | Admitting: Gastroenterology

## 2016-07-28 ENCOUNTER — Encounter (HOSPITAL_COMMUNITY): Payer: Self-pay | Admitting: *Deleted

## 2016-07-28 DIAGNOSIS — Z791 Long term (current) use of non-steroidal anti-inflammatories (NSAID): Secondary | ICD-10-CM | POA: Diagnosis not present

## 2016-07-28 DIAGNOSIS — K8050 Calculus of bile duct without cholangitis or cholecystitis without obstruction: Secondary | ICD-10-CM | POA: Diagnosis not present

## 2016-07-28 DIAGNOSIS — Z85038 Personal history of other malignant neoplasm of large intestine: Secondary | ICD-10-CM | POA: Insufficient documentation

## 2016-07-28 DIAGNOSIS — K219 Gastro-esophageal reflux disease without esophagitis: Secondary | ICD-10-CM | POA: Diagnosis not present

## 2016-07-28 DIAGNOSIS — E039 Hypothyroidism, unspecified: Secondary | ICD-10-CM | POA: Insufficient documentation

## 2016-07-28 DIAGNOSIS — R7989 Other specified abnormal findings of blood chemistry: Secondary | ICD-10-CM

## 2016-07-28 DIAGNOSIS — Z86010 Personal history of colonic polyps: Secondary | ICD-10-CM | POA: Insufficient documentation

## 2016-07-28 DIAGNOSIS — Z79899 Other long term (current) drug therapy: Secondary | ICD-10-CM | POA: Diagnosis not present

## 2016-07-28 DIAGNOSIS — I10 Essential (primary) hypertension: Secondary | ICD-10-CM | POA: Diagnosis not present

## 2016-07-28 DIAGNOSIS — R748 Abnormal levels of other serum enzymes: Secondary | ICD-10-CM | POA: Insufficient documentation

## 2016-07-28 DIAGNOSIS — R945 Abnormal results of liver function studies: Secondary | ICD-10-CM

## 2016-07-28 DIAGNOSIS — Z9049 Acquired absence of other specified parts of digestive tract: Secondary | ICD-10-CM | POA: Insufficient documentation

## 2016-07-28 DIAGNOSIS — D132 Benign neoplasm of duodenum: Secondary | ICD-10-CM | POA: Insufficient documentation

## 2016-07-28 HISTORY — PX: ERCP: SHX5425

## 2016-07-28 HISTORY — DX: Pneumonia, unspecified organism: J18.9

## 2016-07-28 HISTORY — DX: Calculus of bile duct without cholangitis or cholecystitis without obstruction: K80.50

## 2016-07-28 HISTORY — DX: Family history of other specified conditions: Z84.89

## 2016-07-28 SURGERY — ERCP, WITH INTERVENTION IF INDICATED
Anesthesia: General

## 2016-07-28 MED ORDER — LACTATED RINGERS IV SOLN
INTRAVENOUS | Status: DC
  Administered 2016-07-28: 07:00:00 via INTRAVENOUS

## 2016-07-28 MED ORDER — FENTANYL CITRATE (PF) 100 MCG/2ML IJ SOLN
INTRAMUSCULAR | Status: DC | PRN
  Administered 2016-07-28 (×2): 50 ug via INTRAVENOUS

## 2016-07-28 MED ORDER — SUCCINYLCHOLINE CHLORIDE 20 MG/ML IJ SOLN
INTRAMUSCULAR | Status: DC | PRN
  Administered 2016-07-28: 100 mg via INTRAVENOUS

## 2016-07-28 MED ORDER — DEXAMETHASONE SODIUM PHOSPHATE 10 MG/ML IJ SOLN
INTRAMUSCULAR | Status: DC | PRN
  Administered 2016-07-28: 10 mg via INTRAVENOUS

## 2016-07-28 MED ORDER — PROPOFOL 10 MG/ML IV BOLUS
INTRAVENOUS | Status: DC | PRN
  Administered 2016-07-28: 150 mg via INTRAVENOUS

## 2016-07-28 MED ORDER — SODIUM CHLORIDE 0.9 % IV SOLN
INTRAVENOUS | Status: DC | PRN
  Administered 2016-07-28: 20 mL

## 2016-07-28 MED ORDER — LACTATED RINGERS IV SOLN
INTRAVENOUS | Status: DC | PRN
  Administered 2016-07-28: 07:00:00 via INTRAVENOUS

## 2016-07-28 MED ORDER — GLUCAGON HCL RDNA (DIAGNOSTIC) 1 MG IJ SOLR
INTRAMUSCULAR | Status: AC
  Filled 2016-07-28: qty 1

## 2016-07-28 MED ORDER — LACTATED RINGERS IV SOLN: INTRAVENOUS | Status: DC | PRN

## 2016-07-28 MED ORDER — GLUCAGON HCL RDNA (DIAGNOSTIC) 1 MG IJ SOLR
INTRAMUSCULAR | Status: AC
Start: 2016-07-28 — End: 2016-07-28
  Filled 2016-07-28: qty 1

## 2016-07-28 MED ORDER — METOCLOPRAMIDE HCL 5 MG/ML IJ SOLN
INTRAMUSCULAR | Status: DC | PRN
  Administered 2016-07-28: 10 mg via INTRAVENOUS

## 2016-07-28 MED ORDER — SUGAMMADEX SODIUM 200 MG/2ML IV SOLN
INTRAVENOUS | Status: DC | PRN
  Administered 2016-07-28: 200 mg via INTRAVENOUS

## 2016-07-28 MED ORDER — ARTIFICIAL TEARS OP OINT
TOPICAL_OINTMENT | OPHTHALMIC | Status: DC | PRN
  Administered 2016-07-28: 1 via OPHTHALMIC

## 2016-07-28 MED ORDER — LIDOCAINE HCL (CARDIAC) 20 MG/ML IV SOLN
INTRAVENOUS | Status: DC | PRN
  Administered 2016-07-28: 60 mg via INTRAVENOUS

## 2016-07-28 MED ORDER — AMPICILLIN-SULBACTAM SODIUM 1.5 (1-0.5) G IJ SOLR
1.5000 g | Freq: Once | INTRAMUSCULAR | Status: AC
  Administered 2016-07-28: 1.5 g via INTRAVENOUS
  Filled 2016-07-28: qty 1.5

## 2016-07-28 MED ORDER — PHENYLEPHRINE HCL 10 MG/ML IJ SOLN
INTRAVENOUS | Status: DC | PRN
  Administered 2016-07-28: 20 ug/min via INTRAVENOUS

## 2016-07-28 MED ORDER — IOPAMIDOL (ISOVUE-300) INJECTION 61%
INTRAVENOUS | Status: AC
  Filled 2016-07-28: qty 50

## 2016-07-28 MED ORDER — GLUCAGON HCL RDNA (DIAGNOSTIC) 1 MG IJ SOLR
INTRAMUSCULAR | Status: DC | PRN
  Administered 2016-07-28: .25 mg via INTRAVENOUS

## 2016-07-28 MED ORDER — INDOMETHACIN 50 MG RE SUPP
RECTAL | Status: DC | PRN
  Administered 2016-07-28: 100 mg via RECTAL

## 2016-07-28 MED ORDER — ONDANSETRON HCL 4 MG/2ML IJ SOLN
INTRAMUSCULAR | Status: DC | PRN
  Administered 2016-07-28 (×2): 4 mg via INTRAVENOUS

## 2016-07-28 MED ORDER — INDOMETHACIN 50 MG RE SUPP
RECTAL | Status: AC
  Filled 2016-07-28: qty 2

## 2016-07-28 MED ORDER — SODIUM CHLORIDE 0.9 % IV SOLN: INTRAVENOUS | Status: DC

## 2016-07-28 MED ORDER — MIDAZOLAM HCL 5 MG/5ML IJ SOLN
INTRAMUSCULAR | Status: DC | PRN
  Administered 2016-07-28: 2 mg via INTRAVENOUS

## 2016-07-28 MED ORDER — INDOMETHACIN 50 MG RE SUPP: 100.0000 mg | Freq: Once | RECTAL | Status: DC

## 2016-07-28 MED ORDER — ROCURONIUM BROMIDE 100 MG/10ML IV SOLN
INTRAVENOUS | Status: DC | PRN
  Administered 2016-07-28: 30 mg via INTRAVENOUS

## 2016-07-28 NOTE — Anesthesia Procedure Notes (Signed)
Procedure Name: Intubation Date/Time: 07/28/2016 8:21 AM Performed by: Jacquiline Doe A Pre-anesthesia Checklist: Patient identified, Emergency Drugs available, Suction available and Patient being monitored Patient Re-evaluated:Patient Re-evaluated prior to inductionOxygen Delivery Method: Circle System Utilized and Circle system utilized Preoxygenation: Pre-oxygenation with 100% oxygen Intubation Type: IV induction, Cricoid Pressure applied and Rapid sequence Laryngoscope Size: Mac and 3 Grade View: Grade I Tube type: Oral Tube size: 7.0 mm Number of attempts: 1 Airway Equipment and Method: Stylet and Oral airway Placement Confirmation: ETT inserted through vocal cords under direct vision,  positive ETCO2 and breath sounds checked- equal and bilateral Secured at: 21 cm Tube secured with: Tape Dental Injury: Teeth and Oropharynx as per pre-operative assessment

## 2016-07-28 NOTE — Anesthesia Postprocedure Evaluation (Signed)
Anesthesia Post Note  Patient: [name]  Procedure(s) Performed: Procedure(s) (LRB): ENDOSCOPIC RETROGRADE CHOLANGIOPANCREATOGRAPHY (ERCP) (N/A)  Patient location during evaluation: PACU Anesthesia Type: General Level of consciousness: awake and alert Pain management: pain level controlled Vital Signs Assessment: post-procedure vital signs reviewed and stable Respiratory status: spontaneous breathing, nonlabored ventilation, respiratory function stable and patient connected to nasal cannula oxygen Cardiovascular status: blood pressure returned to baseline and stable Postop Assessment: no signs of nausea or vomiting Anesthetic complications: no    Last Vitals:  Vitals:   07/28/16 1000 07/28/16 1010  BP: (!) 152/75 (!) 158/72  Pulse: 84 92  Resp: 15 15  Temp:      Last Pain:  Vitals:   07/28/16 0935  TempSrc: Oral  PainSc:                  Oreta Soloway S

## 2016-07-28 NOTE — Addendum Note (Signed)
Addendum  created 07/28/16 1150 by Fidela Juneau, CRNA   Anesthesia Intra Flowsheets edited

## 2016-07-28 NOTE — Discharge Instructions (Signed)
General Anesthesia, Adult, Care After These instructions provide you with information about caring for yourself after your procedure. Your health care provider may also give you more specific instructions. Your treatment has been planned according to current medical practices, but problems sometimes occur. Call your health care provider if you have any problems or questions after your procedure. What can I expect after the procedure? After the procedure, it is common to have:  Vomiting.  A sore throat.  Mental slowness. It is common to feel:  Nauseous.  Cold or shivery.  Sleepy.  Tired.  Sore or achy, even in parts of your body where you did not have surgery. Follow these instructions at home: For at least 24 hours after the procedure:  Do not:  Participate in activities where you could fall or become injured.  Drive.  Use heavy machinery.  Drink alcohol.  Take sleeping pills or medicines that cause drowsiness.  Make important decisions or sign legal documents.  Take care of children on your own.  Rest. Eating and drinking  If you vomit, drink water, juice, or soup when you can drink without vomiting.  Drink enough fluid to keep your urine clear or pale yellow.  Make sure you have little or no nausea before eating solid foods.  Follow the diet recommended by your health care provider. General instructions  Have a responsible adult stay with you until you are awake and alert.  Return to your normal activities as told by your health care provider. Ask your health care provider what activities are safe for you.  Take over-the-counter and prescription medicines only as told by your health care provider.  If you smoke, do not smoke without supervision.  Keep all follow-up visits as told by your health care provider. This is important. Contact a health care provider if:  You continue to have nausea or vomiting at home, and medicines are not helpful.  You  cannot drink fluids or start eating again.  You cannot urinate after 8-12 hours.  You develop a skin rash.  You have fever.  You have increasing redness at the site of your procedure. Get help right away if:  You have difficulty breathing.  You have chest pain.  You have unexpected bleeding.  You feel that you are having a life-threatening or urgent problem. This information is not intended to replace advice given to you by your health care provider. Make sure you discuss any questions you have with your health care provider. Document Released: 11/14/2000 Document Revised: 01/11/2016 Document Reviewed: 07/23/2015 Elsevier Interactive Patient Education  2017 [employer]   Endoscopic Retrograde Cholangiopancreatography (ERCP), Care After Refer to this sheet in the next few weeks. These instructions provide you with information on caring for yourself after your procedure. Your health care provider may also give you more specific instructions. Your treatment has been planned according to current medical practices, but problems sometimes occur. Call your health care provider if you have any problems or questions after your procedure.  WHAT TO EXPECT AFTER THE PROCEDURE  After your procedure, it is typical to feel:   Soreness in your throat.   Sick to your stomach (nauseous).   Bloated.  Dizzy.   Fatigued. HOME CARE INSTRUCTIONS  Have a friend or family member stay with you for the first 24 hours after your procedure.  Start taking your usual medicines and eating normally as soon as you feel well enough to do so or as directed by your health care provider.  SEEK MEDICAL CARE IF:  You have abdominal pain.   You develop signs of infection, such as:   Chills.   Feeling unwell.  SEEK IMMEDIATE MEDICAL CARE IF:  You have difficulty swallowing.  You have worsening throat, chest, or abdominal pain.  You vomit.  You have bloody or very black stools.  You have a  fever. This information is not intended to replace advice given to you by your health care provider. Make sure you discuss any questions you have with your health care provider. Document Released: 05/29/2013 Document Reviewed: 05/29/2013 Elsevier Interactive Patient Education  2017 [employer].

## 2016-07-28 NOTE — Op Note (Signed)
[facility] Patient Name: Jacqueline Gamble Procedure Date : 07/28/2016 MRN: [id number] Attending MD: Ladene Artist , MD Date of Birth: 1941/03/25 CSN: [id number] Age: 75 Admit Type: Outpatient Procedure:                ERCP Indications:              Bile duct stone(s), Elevated liver enzymes, Stone                            removal Providers:                Pricilla Riffle. Fuller Plan, MD, Cleda Daub, RN, Elspeth Cho Tech., Technician, Jacquiline Doe, CRNA Referring MD:             Geradine Girt, MD Medicines:                General Anesthesia Complications:            No immediate complications. Estimated Blood Loss:     Estimated blood loss: none. Procedure:                Pre-Anesthesia Assessment:                           - Prior to the procedure, a History and Physical                            was performed, and patient medications and                            allergies were reviewed. The patient's tolerance of                            previous anesthesia was also reviewed. The risks                            and benefits of the procedure and the sedation                            options and risks were discussed with the patient.                            All questions were answered, and informed consent                            was obtained. Prior Anticoagulants: The patient has                            taken no previous anticoagulant or antiplatelet                            agents. ASA Grade Assessment: II - A patient with  mild systemic disease. After reviewing the risks                            and benefits, the patient was deemed in                            satisfactory condition to undergo the procedure.                           After obtaining informed consent, the scope was                            passed under direct vision. Throughout the                            procedure, the  patient's blood pressure, pulse, and                            oxygen saturations were monitored continuously. The                            was introduced through the mouth, and used to                            inject contrast into and used to inject contrast                            into the bile duct. The ERCP was accomplished                            without difficulty. The patient tolerated the                            procedure well. Scope In: Scope Out: Findings:      A scout film of the abdomen was obtained. Surgical clips, consistent       with a previous cholecystectomy, were seen in the area of the right       upper quadrant of the abdomen. The esophagus was successfully intubated       under direct vision. The scope was advanced to a normal major papilla in       the descending duodenum without detailed examination of the pharynx,       larynx and associated structures, and upper GI tract. The upper GI tract       was grossly normal except for a descending duodenal polyp vs prominent       minor ampulla. It was biopsied. The bile duct was deeply cannulated with       the short-nosed traction sphincterotome. Contrast was injected. I       personally interpreted the bile duct images. Ductal flow of contrast was       adequate. The common bile duct contained three stones and sludge, the       largest of which was 7 mm in diameter. The common bile duct was       diffusely dilated, with stones causing an obstruction. The largest CBD  diameter was 66mm. A straight Roadrunner wire was passed into the       biliary tree. A 7 mm biliary sphincterotomy was made with a traction       (standard) sphincterotome. There was no post-sphincterotomy bleeding.       The biliary tree was swept with a 12 mm balloon starting at the       bifurcation. Three stones and sludge were removed. No stones or sludge       remained. Excellent biliary drainage was then noted. The PD was not        cannulated or injected by intention. Impression:               - Duodenal polyp vs prominent minor ampulla.                            Biopsied.                           - Choledocholithiasis was found. Complete removal                            was accomplished by biliary sphincterotomy and                            balloon extraction.                           - A biliary sphincterotomy was performed.                           - The biliary tree was swept.                           - Prior cholecystectomy Recommendation:           - Discharge patient to home (with spouse).                           - Avoid aspirin and nonsteroidal anti-inflammatory                            medicines for 1 week.                           - Await path results.                           - Continue current medications                           - Clear liquid diet for 2 hrs then advance as                            tolerated                           - Repeat LFTs in 6 weeks Procedure Code(s):        --- Professional ---  43264, Endoscopic retrograde                            cholangiopancreatography (ERCP); with removal of                            calculi/debris from biliary/pancreatic duct(s)                           43262, Endoscopic retrograde                            cholangiopancreatography (ERCP); with                            sphincterotomy/papillotomy Diagnosis Code(s):        --- Professional ---                           K80.51, Calculus of bile duct without cholangitis                            or cholecystitis with obstruction                           R74.8, Abnormal levels of other serum enzymes CPT copyright 2016 American Medical Association. All rights reserved. The codes documented in this report are preliminary and upon coder review may  be revised to meet current compliance requirements. Ladene Artist, MD 07/28/2016 9:30:48 AM This report  has been signed electronically. Number of Addenda: 0

## 2016-07-28 NOTE — Transfer of Care (Signed)
Immediate Anesthesia Transfer of Care Note  Patient: [name]  Procedure(s) Performed: Procedure(s): ENDOSCOPIC RETROGRADE CHOLANGIOPANCREATOGRAPHY (ERCP) (N/A)  Patient Location: Endoscopy Unit  Anesthesia Type:General  Level of Consciousness: awake, oriented, sedated, patient cooperative and responds to stimulation  Airway & Oxygen Therapy: Patient Spontanous Breathing and Patient connected to nasal cannula oxygen  Post-op Assessment: Report given to RN, Post -op Vital signs reviewed and stable, Patient moving all extremities and Patient moving all extremities X 4  Post vital signs: Reviewed and stable  Last Vitals:  Vitals:   07/28/16 0701 07/28/16 0935  BP: (!) 159/97 (!) 145/70  Pulse: 90 89  Resp: 19 12  Temp: 36.6 C 36.5 C    Last Pain:  Vitals:   07/28/16 0935  TempSrc: Oral  PainSc:       Patients Stated Pain Goal: 2 (81/10/31 5945)  Complications: No apparent anesthesia complications

## 2016-07-28 NOTE — Anesthesia Preprocedure Evaluation (Addendum)
Anesthesia Evaluation  Patient identified by MRN, date of birth, ID band Patient awake    Reviewed: Allergy & Precautions, NPO status , Patient's Chart, lab work & pertinent test results  Airway Mallampati: II  TM Distance: >3 FB Neck ROM: Full    Dental no notable dental hx.    Pulmonary neg pulmonary ROS,    Pulmonary exam normal breath sounds clear to auscultation       Cardiovascular hypertension, Pt. on medications Normal cardiovascular exam Rhythm:Regular Rate:Normal     Neuro/Psych negative neurological ROS  negative psych ROS   GI/Hepatic Neg liver ROS, GERD  ,  Endo/Other  Hypothyroidism   Renal/GU negative Renal ROS  negative genitourinary   Musculoskeletal negative musculoskeletal ROS (+)   Abdominal   Peds negative pediatric ROS (+)  Hematology negative hematology ROS (+)   Anesthesia Other Findings   Reproductive/Obstetrics negative OB ROS                             Anesthesia Physical Anesthesia Plan  ASA: II  Anesthesia Plan: General   Post-op Pain Management:    Induction: Intravenous  Airway Management Planned: Oral ETT  Additional Equipment:   Intra-op Plan:   Post-operative Plan: Extubation in OR  Informed Consent: I have reviewed the patients History and Physical, chart, labs and discussed the procedure including the risks, benefits and alternatives for the proposed anesthesia with the patient or authorized representative who has indicated his/her understanding and acceptance.   Dental advisory given  Plan Discussed with: CRNA and Surgeon  Anesthesia Plan Comments:         Anesthesia Quick Evaluation

## 2016-07-28 NOTE — H&P (Signed)
History of Present Illness: This is a 75 year old female referred by the ED for the evaluation of constipation, right-sided abdominal pain, abdominal bloating, vomiting and elevated LFTs. She relates worsening problems with constipation over several months. She states she's tried taking MiraLAX which is only partially helped her symptoms and occasionally it makes her vomit so she doesn't use it regularly. She has intermittent episodes of vomiting with solid foods and with liquids for about 2 months. These symptoms do not necessarily correlate with her right-sided pain which seems to worsen as her constipation worsens and it relieved with a bowel movement. She was evaluated in the ED on 9/7. Alkaline phosphatase=280, ALT=72. K=3.4, glucose=140 otherwise normal. CT as below. She states she was taking 3 Aleve per day for a few months but discontinued when her nausea and vomiting started  Abdomen/pelvic CT without IV contrast 04/28/2016 IMPRESSION:  No bowel wall thickening or bowel obstruction. 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 There is also a diverticulum at a suture line in the proximal sigmoid colon region without complicating features. There is no abscess. Left kidney small compared to right kidney, a stable finding. Question a degree of renal artery stenosis. In this regard, question whether patient is hypertensive. No renal or ureteral calculi. No hydronephrosis. Gallbladder, appendix, and uterus are absent. 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   MRI/MRCP IMPRESSION 07/10/2016: 1. Mildly motion degraded exam. 2. Cholecystectomy with choledocholithiasis. Upper normal common duct size. 3. Right renal lesion which demonstrates nonenhancing septae . 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 Given relative size stability since 11/11/2013, consider follow-up with pre and post contrast renal protocol CT or MRI at 6-12 months. 4. Large duodenal diverticulum.  Review of  Systems: Pertinent positive and negative review of systems were noted in the above HPI section. All other review of systems were otherwise negative.  Current Medications, Allergies, Past Medical History, Past Surgical History, Family History and Social History were reviewed in [employer] record.  Physical Exam: General: Well developed, well nourished, no acute distress Head: Normocephalic and atraumatic Eyes:  sclerae anicteric, EOMI Ears: Normal auditory acuity Mouth: No deformity or lesions Neck: Supple, no masses or thyromegaly Lungs: Clear throughout to auscultation Heart: Regular rate and rhythm; no murmurs, rubs or bruits Abdomen: Soft, mild right sided tenderness and non distended. No masses, hepatosplenomegaly or hernias noted. Normal Bowel sounds Rectal: deferred to colonoscopy  Musculoskeletal: Symmetrical with no gross deformities  Skin: No lesions on visible extremities Pulses:  Normal pulses noted Extremities: No clubbing, cyanosis, edema or deformities noted Neurological: Alert oriented x 4, grossly nonfocal Cervical Nodes:  No significant cervical adenopathy Inguinal Nodes: No significant inguinal adenopathy Psychological:  Alert and cooperative. Normal mood and affect  Assessment and Recommendations:  1. Right sided abdominal pain, abdominal bloating likely related to choledocholithiasis and constipation. MOM once or twice daily to maintain adequate bowel movements. Consider Linzess or Trulance if MOM not effective. Schedule ERCP. The risks (including bleeding, perforation, infection, missed lesions, pancreatitis, medication reactions and possible hospitalization or surgery if complications occur), benefits, and alternatives to endoscopy with possible biopsy and possible dilation were discussed with the patient and they consent to proceed.   2. Nausea and vomiting. EGD showed gastritis. CBD stone on MRCP. Discontinue all aspirin and NSAIDs.  Increase pantoprazole to 40 mg twice daily. Continue Phenergan prn.   3. Elevated LFTs. Like due to CBD stone.   4. Personal history of colon cancer in 1986 and personal history of  adenomatous colon polyps. Five-year interval colonoscopy is recommended in November 2018.

## 2016-07-29 ENCOUNTER — Encounter (HOSPITAL_COMMUNITY): Payer: Self-pay | Admitting: Gastroenterology

## 2016-08-04 ENCOUNTER — Other Ambulatory Visit: Payer: Self-pay

## 2016-08-04 DIAGNOSIS — K317 Polyp of stomach and duodenum: Secondary | ICD-10-CM

## 2016-08-05 ENCOUNTER — Telehealth: Payer: Self-pay

## 2016-08-05 NOTE — Telephone Encounter (Signed)
I cancelled the appt for 09/06/16 for EGD at [facility].   Left message for patient to call back

## 2016-08-05 NOTE — Telephone Encounter (Signed)
I explained to the patient the recommendations.  Records faxed to [facility].  She is agreeable to proceed to [facility].

## 2016-08-05 NOTE — Telephone Encounter (Signed)
-----   Message from Ladene Artist, MD sent at 08/05/2016  8:49 AM EST ----- I sent a result note with this patients recent pathology late yesterday however after further consideration yesterdays plan needs to be changed.   This polyp is near the minor ampulla or involves the minor ampulla. Polypectomy/ampullectomy needed at a tertiary center and may need EUS prior to polypectomy/ampullectomy.  Recommend referral to Dr. Tillie Rung at [facility].

## 2016-09-06 ENCOUNTER — Encounter (HOSPITAL_COMMUNITY): Payer: Self-pay

## 2016-09-06 ENCOUNTER — Ambulatory Visit (HOSPITAL_COMMUNITY): Admit: 2016-09-06 | Payer: Medicare Other | Admitting: Gastroenterology

## 2016-09-06 SURGERY — ESOPHAGOGASTRODUODENOSCOPY (EGD) WITH PROPOFOL
Anesthesia: Monitor Anesthesia Care

## 2016-09-09 ENCOUNTER — Telehealth: Payer: Self-pay | Admitting: Gastroenterology

## 2016-09-09 NOTE — Telephone Encounter (Signed)
Patient advised additional records were faxed at their request on 09/02/16.  She is going to call and speak with the coordinator again.  She will call back if she has any problems scheduling

## 2016-09-19 NOTE — Telephone Encounter (Signed)
Patient will be seen on Friday at [facility].  Patient is aware of the appt

## 2016-09-23 DIAGNOSIS — D132 Benign neoplasm of duodenum: Secondary | ICD-10-CM | POA: Insufficient documentation

## 2016-09-27 ENCOUNTER — Telehealth: Payer: Self-pay | Admitting: Gastroenterology

## 2016-09-27 NOTE — Telephone Encounter (Signed)
Ms. Effertz wanted you to know that she had an EUS and a ERCP last week at [facility].  They are sending a report.  She reports that she did have the ampullary mass removed and then had choledocholithiasis and had ERCP for removal.  I put the office note in your office.  Procedures are not visible in Careeverywhere

## 2017-03-06 ENCOUNTER — Other Ambulatory Visit: Payer: Self-pay | Admitting: Urology

## 2017-03-06 DIAGNOSIS — N29 Other disorders of kidney and ureter in diseases classified elsewhere: Secondary | ICD-10-CM

## 2017-04-17 ENCOUNTER — Ambulatory Visit (HOSPITAL_COMMUNITY)
Admission: RE | Admit: 2017-04-17 | Discharge: 2017-04-17 | Disposition: A | Discharge status: Home / Self Care | Payer: Medicare Other | Source: Ambulatory Visit | Attending: Urology | Admitting: Urology

## 2017-04-17 ENCOUNTER — Other Ambulatory Visit: Payer: Self-pay | Admitting: Urology

## 2017-04-17 DIAGNOSIS — N29 Other disorders of kidney and ureter in diseases classified elsewhere: Secondary | ICD-10-CM | POA: Insufficient documentation

## 2017-04-17 DIAGNOSIS — Z9049 Acquired absence of other specified parts of digestive tract: Secondary | ICD-10-CM | POA: Diagnosis not present

## 2017-04-17 DIAGNOSIS — K5730 Diverticulosis of large intestine without perforation or abscess without bleeding: Secondary | ICD-10-CM | POA: Diagnosis not present

## 2017-04-17 DIAGNOSIS — K449 Diaphragmatic hernia without obstruction or gangrene: Secondary | ICD-10-CM | POA: Diagnosis not present

## 2017-04-17 DIAGNOSIS — N281 Cyst of kidney, acquired: Secondary | ICD-10-CM | POA: Diagnosis not present

## 2017-04-17 MED ORDER — GADOBENATE DIMEGLUMINE 529 MG/ML IV SOLN: 20.0000 mL | Freq: Once | INTRAVENOUS | Status: DC | PRN

## 2017-10-25 ENCOUNTER — Telehealth: Payer: Self-pay | Admitting: Internal Medicine

## 2017-10-25 NOTE — Telephone Encounter (Signed)
Patient had recall EGD that she reports was normal.  They asked that she return to Dr. Fuller Plan for further GI care. She is scheduled for OV on 12/05/17

## 2017-11-30 DIAGNOSIS — E119 Type 2 diabetes mellitus without complications: Secondary | ICD-10-CM | POA: Insufficient documentation

## 2017-12-05 ENCOUNTER — Encounter: Payer: Self-pay | Admitting: Gastroenterology

## 2017-12-05 ENCOUNTER — Ambulatory Visit (INDEPENDENT_AMBULATORY_CARE_PROVIDER_SITE_OTHER): Payer: Medicare Other | Admitting: Gastroenterology

## 2017-12-05 VITALS — BP 162/78 | HR 70 | Ht 60.0 in | Wt 221.2 lb

## 2017-12-05 DIAGNOSIS — Z85038 Personal history of other malignant neoplasm of large intestine: Secondary | ICD-10-CM | POA: Diagnosis not present

## 2017-12-05 DIAGNOSIS — R1031 Right lower quadrant pain: Secondary | ICD-10-CM

## 2017-12-05 DIAGNOSIS — K219 Gastro-esophageal reflux disease without esophagitis: Secondary | ICD-10-CM

## 2017-12-05 DIAGNOSIS — D132 Benign neoplasm of duodenum: Secondary | ICD-10-CM | POA: Diagnosis not present

## 2017-12-05 MED ORDER — DICYCLOMINE HCL 10 MG PO CAPS: 10.0000 mg | ORAL_CAPSULE | Freq: Three times a day (TID) | ORAL | 11 refills | Status: DC

## 2017-12-05 MED ORDER — NA SULFATE-K SULFATE-MG SULF 17.5-3.13-1.6 GM/177ML PO SOLN: 1.0000 | Freq: Once | ORAL | 0 refills | Status: AC

## 2017-12-05 NOTE — Patient Instructions (Signed)
We have sent the following medications to your pharmacy for you to pick up at your convenience: dicyclomine.   You have been scheduled for a colonoscopy. Please follow written instructions given to you at your visit today.  Please pick up your prep supplies at the pharmacy within the next 1-3 days. If you use inhalers (even only as needed), please bring them with you on the day of your procedure. Your physician has requested that you go to www.startemmi.com and enter the access code given to you at your visit today. This web site gives a general overview about your procedure. However, you should still follow specific instructions given to you by our office regarding your preparation for the procedure.  Normal BMI (Body Mass Index- based on height and weight) is between 23 and 30. Your BMI today is Body mass index is 43.21 kg/m. Marland Kitchen Please consider follow up  regarding your BMI with your Primary Care Provider.  Thank you for choosing me and [facility].  Pricilla Riffle. Dagoberto Ligas., MD., Marval Regal

## 2017-12-05 NOTE — Progress Notes (Signed)
    History of Present Illness: This is a 77 year old female returning for follow-up for remote history of colon cancer and intermittent right lower quadrant pain.  She states she notes the pain before bowel movements and symptoms are often relieved with a bowel movement. The symptoms occur most days.  They have not changed in many months.  She is status post EMR of a periampullary duodenal adenomatous polyp at [facility] on 09/26/2016.  I can review some [facility] records in [place] but not all.  Appears that she underwent repeat EGD by Dr. Francella Solian on September 25, 2017 and the patient reports that there was no evidence of duodenal polyp.   [facility] path report:  A. Duodenal polyp, endoscopic mucosal resection: Tubular adenoma of duodenal mucosa. Negative for high grade dysplasia and carcinoma. The peripheral resection margins are negative for dysplasia and carcinoma. The deep resection margin is negative for dysplasia and carcinoma. Background findings include unremarkable portions of the ampulla of Vater and adherent pancreatic tissue.   [place] [facility] Electronically signed by Saliah Barge, MD on 09/28/2016 at 10:31 AM  Clinical Information Single duodenal polyp. Area successfully injected for EMR. Polypectomy was performed. Complete removal. The entire main bile duct was mildly dilated. The biliary tree was swept and sludge was found.        Current Medications, Allergies, Past Medical History, Past Surgical History, Family History and Social History were reviewed in [employer] record.  Physical Exam: General: Well developed, well nourished, no acute distress Head: Normocephalic and atraumatic Eyes:  sclerae anicteric, EOMI Ears: Normal auditory acuity Mouth: No deformity or lesions Lungs: Clear throughout to auscultation Heart: Regular rate and rhythm; no murmurs, rubs or bruits Abdomen: Soft, non tender and non distended. No masses,  hepatosplenomegaly or hernias noted. Normal Bowel sounds Rectal: Deferred to colonoscopy Musculoskeletal: Symmetrical with no gross deformities  Pulses:  Normal pulses noted Extremities: No clubbing, cyanosis, edema or deformities noted Neurological: Alert oriented x 4, grossly nonfocal Psychological:  Alert and cooperative. Normal mood and affect  Assessment and Recommendations:  1.  Personal history of colon cancer.  Status post remote asigmoid colectomy.  She is due for a 5-year interval colonoscopy.  Schedule colonoscopy. The risks (including bleeding, perforation, infection, missed lesions, medication reactions and possible hospitalization or surgery if complications occur), benefits, and alternatives to colonoscopy with possible biopsy and possible polypectomy were discussed with the patient and they consent to proceed.   2.  Right lower quadrant pain associated with bowel movements.  Begin dicyclomine 10 mg 3 times daily ac.  3.  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  Path report shows evidence of a complete resection.  Patient relates that her follow-up EGD in 09/2017 was clear.  Request EGD, EUS and other records not available in [place] from [facility].    4. History of choledocholithiasis status post ERCP sphincterotomy, stone extraction, sludge extraction.  5. GERD.  Continue pantoprazole 40 mg twice daily and follow standard antireflux measures.

## 2018-01-18 ENCOUNTER — Ambulatory Visit (AMBULATORY_SURGERY_CENTER): Payer: Medicare Other | Admitting: Gastroenterology

## 2018-01-18 ENCOUNTER — Encounter: Payer: Self-pay | Admitting: Gastroenterology

## 2018-01-18 ENCOUNTER — Other Ambulatory Visit: Payer: Self-pay

## 2018-01-18 VITALS — BP 140/83 | HR 71 | Temp 99.3°F | Resp 17 | Ht 60.0 in | Wt 221.0 lb

## 2018-01-18 DIAGNOSIS — D120 Benign neoplasm of cecum: Secondary | ICD-10-CM | POA: Diagnosis not present

## 2018-01-18 DIAGNOSIS — D125 Benign neoplasm of sigmoid colon: Secondary | ICD-10-CM

## 2018-01-18 DIAGNOSIS — Z85038 Personal history of other malignant neoplasm of large intestine: Secondary | ICD-10-CM

## 2018-01-18 DIAGNOSIS — D124 Benign neoplasm of descending colon: Secondary | ICD-10-CM

## 2018-01-18 DIAGNOSIS — Z86010 Personal history of colonic polyps: Secondary | ICD-10-CM | POA: Diagnosis present

## 2018-01-18 DIAGNOSIS — D123 Benign neoplasm of transverse colon: Secondary | ICD-10-CM

## 2018-01-18 MED ORDER — SODIUM CHLORIDE 0.9 % IV SOLN: 500.0000 mL | Freq: Once | INTRAVENOUS | Status: DC

## 2018-01-18 NOTE — Progress Notes (Signed)
Called to room to assist during endoscopic procedure.  Patient ID and intended procedure confirmed with present staff. Received instructions for my participation in the procedure from the performing physician.  

## 2018-01-18 NOTE — Progress Notes (Signed)
Pt's states no medical or surgical changes since previsit or office visit. 

## 2018-01-18 NOTE — Op Note (Addendum)
[place] Patient Name: Jacqueline Gamble Procedure Date: 01/18/2018 11:27 AM MRN: [id number] Endoscopist: Ladene Artist , MD Age: 77 Referring MD:  Date of Birth: [birth year] Gender: Female Account #: [id number] Procedure:                Colonoscopy Indications:              High risk colon cancer surveillance: Personal                            history of colon cancer. Personal history of                            adenomatous colon polyps. Medicines:                Monitored Anesthesia Care Procedure:                Pre-Anesthesia Assessment:                           - Prior to the procedure, a History and Physical                            was performed, and patient medications and                            allergies were reviewed. The patient's tolerance of                            previous anesthesia was also reviewed. The risks                            and benefits of the procedure and the sedation                            options and risks were discussed with the patient.                            All questions were answered, and informed consent                            was obtained. Prior Anticoagulants: The patient has                            taken no previous anticoagulant or antiplatelet                            agents. ASA Grade Assessment: II - A patient with                            mild systemic disease. After reviewing the risks                            and benefits, the patient was deemed in  satisfactory condition to undergo the procedure.                           After obtaining informed consent, the colonoscope                            was passed under direct vision. Throughout the                            procedure, the patient's blood pressure, pulse, and                            oxygen saturations were monitored continuously. The                            Model PCF-H190DL [id number]) scope was  introduced                            through the anus and advanced to the the cecum,                            identified by appendiceal orifice and ileocecal                            valve. The ileocecal valve, appendiceal orifice,                            and rectum were photographed. The quality of the                            bowel preparation was adequate after extensive                            lavage and suctioning. The colonoscopy was                            performed without difficulty. The patient tolerated                            the procedure well. Scope In: 11:29:12 AM Scope Out: 11:59:19 AM Scope Withdrawal Time: 0 hours 24 minutes 50 seconds  Total Procedure Duration: 0 hours 30 minutes 7 seconds  Findings:                 The perianal and digital rectal examinations were                            normal.                           A 35 mm polyp was found in the transverse colon.                            The polyp was sessile. The polyp was removed with a  piecemeal technique using a hot snare. Resection                            and retrieval appeared to be complete. Area was                            tattooed with an injection of 3 mL of Spot (carbon                            black). Tattooed in 3 places with 1 ml of spot                            several cm distal to the polypectomy site                           Four sessile polyps were found in the sigmoid                            colon, descending colon, transverse colon and                            cecum. The polyps were 7 to 8 mm in size. These                            polyps were removed with a cold snare. Resection                            and retrieval were complete.                           A few small-mouthed diverticula were found in the                            descending colon.                           There was evidence of a prior end-to-end                             colo-colonic anastomosis in the sigmoid colon. This                            was patent and was characterized by healthy                            appearing mucosa. The anastomosis was traversed.                           Internal hemorrhoids were found during                            retroflexion. The hemorrhoids were small and Grade  I (internal hemorrhoids that do not prolapse).                           The exam was otherwise without abnormality on                            direct and retroflexion views. Complications:            No immediate complications. Estimated blood loss:                            None. Estimated Blood Loss:     Estimated blood loss: none. Impression:               - One 35 mm polyp in the transverse colon, removed                            piecemeal using a hot snare. Resected and                            retrieved. Tattooed in 3 places with 1 ml of spot                            just distal to the polypectomy site.                           - Four 7 to 8 mm polyps in the sigmoid colon, in                            the descending colon, in the transverse colon and                            in the cecum, removed with a cold snare. Resected                            and retrieved.                           - Diverticulosis in the descending colon.                           - Patent end-to-end colo-colonic anastomosis,                            characterized by healthy appearing mucosa.                           - Internal hemorrhoids.                           - The examination was otherwise normal on direct                            and retroflexion views. Recommendation:           - Repeat colonoscopy in 6 months  for surveillance                            after piecemeal polypectomy with a more extensive                            bowel prep.                           - Patient has a contact  number available for                            emergencies. The signs and symptoms of potential                            delayed complications were discussed with the                            patient. Return to normal activities tomorrow.                            Written discharge instructions were provided to the                            patient.                           - Resume previous diet.                           - Continue present medications.                           - Await pathology results.                           - No aspirin, ibuprofen, naproxen, or other                            non-steroidal anti-inflammatory drugs for 2 weeks                            after polyp removal. Ladene Artist, MD 01/18/2018 12:07:07 PM This report has been signed electronically.

## 2018-01-18 NOTE — Progress Notes (Signed)
To PACU, VSS. Report to RN.TB

## 2018-01-18 NOTE — Patient Instructions (Signed)
YOU HAD AN ENDOSCOPIC PROCEDURE TODAY AT THE [facility]:   Refer to the procedure report that was given to you for any specific questions about what was found during the examination.  If the procedure report does not answer your questions, please call your gastroenterologist to clarify.  If you requested that your care partner not be given the details of your procedure findings, then the procedure report has been included in a sealed envelope for you to review at your convenience later.  YOU SHOULD EXPECT: Some feelings of bloating in the abdomen. Passage of more gas than usual.  Walking can help get rid of the air that was put into your GI tract during the procedure and reduce the bloating. If you had a lower endoscopy (such as a colonoscopy or flexible sigmoidoscopy) you may notice spotting of blood in your stool or on the toilet paper. If you underwent a bowel prep for your procedure, you may not have a normal bowel movement for a few days.  Please Note:  You might notice some irritation and congestion in your nose or some drainage.  This is from the oxygen used during your procedure.  There is no need for concern and it should clear up in a day or so.  SYMPTOMS TO REPORT IMMEDIATELY:   Following lower endoscopy (colonoscopy or flexible sigmoidoscopy):  Excessive amounts of blood in the stool  Significant tenderness or worsening of abdominal pains  Swelling of the abdomen that is new, acute  Fever of 100F or higher  For urgent or emergent issues, a gastroenterologist can be reached at any hour by calling [phone].   DIET:  We do recommend a small meal at first, but then you may proceed to your regular diet.  Drink plenty of fluids but you should avoid alcoholic beverages for 24 hours.  MEDICATIONS: Continue present medications. No Aspirin, Ibuprofen, Naproxen, or other non-steroidal anti-inflammatory drugs for 2 weeks after polyp removal.  Please see handouts given to  you by your recovery nurse.  ACTIVITY:  You should plan to take it easy for the rest of today and you should NOT DRIVE or use heavy machinery until tomorrow (because of the sedation medicines used during the test).    FOLLOW UP: Our staff will call the number listed on your records the next business day following your procedure to check on you and address any questions or concerns that you may have regarding the information given to you following your procedure. If we do not reach you, we will leave a message.  However, if you are feeling well and you are not experiencing any problems, there is no need to return our call.  We will assume that you have returned to your regular daily activities without incident.  If any biopsies were taken you will be contacted by phone or by letter within the next 1-3 weeks.  Please call us at [phone] if you have not heard about the biopsies in 3 weeks.   Thank you for allowing us to provide for your healthcare needs today.  SIGNATURES/CONFIDENTIALITY: You and/or your care partner have signed paperwork which will be entered into your electronic medical record.  These signatures attest to the fact that that the information above on your After Visit Summary has been reviewed and is understood.  Full responsibility of the confidentiality of this discharge information lies with you and/or your care-partner. 

## 2018-01-19 ENCOUNTER — Telehealth: Payer: Self-pay | Admitting: *Deleted

## 2018-01-19 NOTE — Telephone Encounter (Signed)
  Follow up Call-  Call back number 01/18/2018 05/10/2016  Post procedure Call Back phone  # [phone]  Permission to leave phone message Yes Yes  Some recent data might be hidden     Patient questions:  Do you have a fever, pain , or abdominal swelling? No. Pain Score  0 *  Have you tolerated food without any problems? Yes.    Have you been able to return to your normal activities? Yes.    Do you have any questions about your discharge instructions: Diet   No. Medications  No. Follow up visit  No.  Do you have questions or concerns about your Care? No.  Actions: * If pain score is 4 or above: No action needed, pain <4.

## 2018-01-25 ENCOUNTER — Encounter: Payer: Self-pay | Admitting: Gastroenterology

## 2018-06-14 ENCOUNTER — Encounter: Payer: Self-pay | Admitting: Gastroenterology

## 2018-07-24 ENCOUNTER — Ambulatory Visit (AMBULATORY_SURGERY_CENTER): Payer: Self-pay | Admitting: *Deleted

## 2018-07-24 VITALS — Ht 60.0 in | Wt 206.0 lb

## 2018-07-24 DIAGNOSIS — Z85038 Personal history of other malignant neoplasm of large intestine: Secondary | ICD-10-CM

## 2018-07-24 DIAGNOSIS — Z86010 Personal history of colonic polyps: Secondary | ICD-10-CM

## 2018-07-24 MED ORDER — NA SULFATE-K SULFATE-MG SULF 17.5-3.13-1.6 GM/177ML PO SOLN: 1.0000 | Freq: Once | ORAL | 0 refills | Status: AC

## 2018-07-24 NOTE — Progress Notes (Signed)
No egg or soy allergy known to patient  No issues with past sedation with any surgeries  or procedures, no intubation problems  No diet pills per patient No home 02 use per patient  No blood thinners per patient  Pt denies issues with constipation - she states she has severe abd pain with bowel movements- after she moves her bowels the pain subsides - no bleeding noted  No A fib or A flutter  EMMI video sent to pt's e mail - pt declined

## 2018-08-06 ENCOUNTER — Ambulatory Visit: Payer: Medicare Other | Admitting: Gastroenterology

## 2018-08-07 ENCOUNTER — Encounter: Payer: Self-pay | Admitting: Gastroenterology

## 2018-08-07 ENCOUNTER — Ambulatory Visit (AMBULATORY_SURGERY_CENTER): Payer: Medicare Other | Admitting: Gastroenterology

## 2018-08-07 VITALS — BP 126/48 | HR 75 | Temp 98.9°F | Resp 10 | Ht 60.0 in | Wt 206.0 lb

## 2018-08-07 DIAGNOSIS — D123 Benign neoplasm of transverse colon: Secondary | ICD-10-CM

## 2018-08-07 DIAGNOSIS — D122 Benign neoplasm of ascending colon: Secondary | ICD-10-CM | POA: Diagnosis not present

## 2018-08-07 DIAGNOSIS — Z86010 Personal history of colonic polyps: Secondary | ICD-10-CM

## 2018-08-07 MED ORDER — SODIUM CHLORIDE 0.9 % IV SOLN: 500.0000 mL | Freq: Once | INTRAVENOUS | Status: DC

## 2018-08-07 NOTE — Progress Notes (Signed)
PT taken to PACU. Monitors in place. VSS. Report given to RN. 

## 2018-08-07 NOTE — Op Note (Signed)
[place] Patient Name: Jacqueline Gamble Procedure Date: 08/07/2018 11:10 AM MRN: [id number] Endoscopist: Ladene Artist , MD Age: 77 Referring MD:  Date of Birth: 28-Dec-1940 Gender: Female Account #: [id number] Procedure:                Colonoscopy Indications:              Surveillance: Personal history of piecemeal removal                            of large sessile adenoma on last colonoscopy 6                            months ago Medicines:                Monitored Anesthesia Care Procedure:                Pre-Anesthesia Assessment:                           - Prior to the procedure, a History and Physical                            was performed, and patient medications and                            allergies were reviewed. The patient's tolerance of                            previous anesthesia was also reviewed. The risks                            and benefits of the procedure and the sedation                            options and risks were discussed with the patient.                            All questions were answered, and informed consent                            was obtained. Prior Anticoagulants: The patient has                            taken no previous anticoagulant or antiplatelet                            agents. ASA Grade Assessment: II - A patient with                            mild systemic disease. After reviewing the risks                            and benefits, the patient was deemed in  satisfactory condition to undergo the procedure.                           After obtaining informed consent, the colonoscope                            was passed under direct vision. Throughout the                            procedure, the patient's blood pressure, pulse, and                            oxygen saturations were monitored continuously. The                            Colonoscope was introduced through the anus and                             advanced to the the cecum, identified by                            appendiceal orifice and ileocecal valve. The                            ileocecal valve, appendiceal orifice, and rectum                            were photographed. The quality of the bowel                            preparation was adequate. The colonoscopy was                            performed without difficulty. The patient tolerated                            the procedure well. Scope In: 11:27:02 AM Scope Out: 11:51:52 AM Scope Withdrawal Time: 0 hours 20 minutes 57 seconds  Total Procedure Duration: 0 hours 24 minutes 50 seconds  Findings:                 The perianal and digital rectal examinations were                            normal.                           A 12 mm residual polyp was found in the transverse                            colon at the site of prior polypect. The polyp was                            sessile. The polyp was removed with a hot snare.  Resection and retrieval were complete.                           Three tattoos were seen in the transverse colon. A                            post-polypectomy scar was found on the proximal                            side of the tattoo sites.                           Two sessile polyps were found in the ascending                            colon. The polyps were 3 to 5 mm in size. These                            polyps were removed with a cold biopsy forceps.                            Resection and retrieval were complete.                           There was evidence of a prior end-to-end                            colo-colonic anastomosis in the sigmoid colon. This                            was patent and was characterized by healthy                            appearing mucosa.                           Many medium-mouthed diverticula were found in the                            left  colon.                           Internal hemorrhoids were found during                            retroflexion. The hemorrhoids were small and Grade                            I (internal hemorrhoids that do not prolapse).                           The exam was otherwise without abnormality on                            direct and retroflexion  views. Complications:            No immediate complications. Estimated blood loss:                            None. Estimated Blood Loss:     Estimated blood loss: none. Impression:               - One 12 mm polyp in the transverse colon, removed                            with a hot snare. Resected and retrieved.                           - Three tattoos were seen in the transverse colon.                            A post-polypectomy scar was found on the proximal                            side at the tattoo site.                           - Two 3 to 5 mm, non-bleeding polyps in the                            ascending colon. Resected and retrieved.                           - Patent end-to-end colo-colonic anastomosis,                            characterized by healthy appearing mucosa.                           - Diverticulosis in the left colon.                           - Internal hemorrhoids.                           - The examination was otherwise normal on direct                            and retroflexion views. Recommendation:           - Repeat colonoscopy in 1 year for surveillance.                           - Patient has a contact number available for                            emergencies. The signs and symptoms of potential                            delayed complications were discussed with the  patient. Return to normal activities tomorrow.                            Written discharge instructions were provided to the                            patient.                           - Resume previous  diet.                           - Continue present medications.                           - Await pathology results.                           - No aspirin, ibuprofen, naproxen, or other                            non-steroidal anti-inflammatory drugs for 2 weeks                            after polyp removal. Ladene Artist, MD 08/07/2018 12:00:08 PM This report has been signed electronically.

## 2018-08-07 NOTE — Progress Notes (Signed)
Pt's states no medical or surgical changes since previsit or office visit. 

## 2018-08-07 NOTE — Progress Notes (Signed)
Called to room to assist during endoscopic procedure.  Patient ID and intended procedure confirmed with present staff. Received instructions for my participation in the procedure from the performing physician.  

## 2018-08-07 NOTE — Patient Instructions (Signed)
Handouts given on polyps, diverticulosis and hemorrhoids. No NSAIDS for 2 weeks.  No ibuprofen, Aleve, or aspirin.    YOU HAD AN ENDOSCOPIC PROCEDURE TODAY AT [facility]:   Refer to the procedure report that was given to you for any specific questions about what was found during the examination.  If the procedure report does not answer your questions, please call your gastroenterologist to clarify.  If you requested that your care partner not be given the details of your procedure findings, then the procedure report has been included in a sealed envelope for you to review at your convenience later.  YOU SHOULD EXPECT: Some feelings of bloating in the abdomen. Passage of more gas than usual.  Walking can help get rid of the air that was put into your GI tract during the procedure and reduce the bloating. If you had a lower endoscopy (such as a colonoscopy or flexible sigmoidoscopy) you may notice spotting of blood in your stool or on the toilet paper. If you underwent a bowel prep for your procedure, you may not have a normal bowel movement for a few days.  Please Note:  You might notice some irritation and congestion in your nose or some drainage.  This is from the oxygen used during your procedure.  There is no need for concern and it should clear up in a day or so.  SYMPTOMS TO REPORT IMMEDIATELY:   Following lower endoscopy (colonoscopy or flexible sigmoidoscopy):  Excessive amounts of blood in the stool  Significant tenderness or worsening of abdominal pains  Swelling of the abdomen that is new, acute  Fever of 100F or higher   For urgent or emergent issues, a gastroenterologist can be reached at any hour by calling [phone].   DIET:  We do recommend a small meal at first, but then you may proceed to your regular diet.  Drink plenty of fluids but you should avoid alcoholic beverages for 24 hours.  ACTIVITY:  You should plan to take it easy for the rest of today  and you should NOT DRIVE or use heavy machinery until tomorrow (because of the sedation medicines used during the test).    FOLLOW UP: Our staff will call the number listed on your records the next business day following your procedure to check on you and address any questions or concerns that you may have regarding the information given to you following your procedure. If we do not reach you, we will leave a message.  However, if you are feeling well and you are not experiencing any problems, there is no need to return our call.  We will assume that you have returned to your regular daily activities without incident.  If any biopsies were taken you will be contacted by phone or by letter within the next 1-3 weeks.  Please call us at [phone] if you have not heard about the biopsies in 3 weeks.    SIGNATURES/CONFIDENTIALITY: You and/or your care partner have signed paperwork which will be entered into your electronic medical record.  These signatures attest to the fact that that the information above on your After Visit Summary has been reviewed and is understood.  Full responsibility of the confidentiality of this discharge information lies with you and/or your care-partner.

## 2018-08-08 ENCOUNTER — Telehealth: Payer: Self-pay | Admitting: *Deleted

## 2018-08-08 NOTE — Telephone Encounter (Signed)
  Follow up Call-  Call back number 08/07/2018 01/18/2018 05/10/2016  Post procedure Call Back phone  # [phone]  Permission to leave phone message Yes Yes Yes  Some recent data might be hidden     Patient questions:  Do you have a fever, pain , or abdominal swelling? No. Pain Score  4 * sore throat unrelated stated patient  Have you tolerated food without any problems? Yes.    Have you been able to return to your normal activities? Yes.    Do you have any questions about your discharge instructions: Diet   Yes.   Medications  Yes.   Follow up visit  No.  Do you have questions or concerns about your Care? No.  Actions: * If pain score is 4 or above: No action needed, pain <4.

## 2018-08-10 ENCOUNTER — Encounter: Payer: Self-pay | Admitting: Gastroenterology

## 2018-12-30 ENCOUNTER — Other Ambulatory Visit: Payer: Self-pay | Admitting: Gastroenterology

## 2019-01-01 ENCOUNTER — Other Ambulatory Visit: Payer: Self-pay | Admitting: Gastroenterology

## 2019-01-03 ENCOUNTER — Other Ambulatory Visit: Payer: Self-pay

## 2019-01-03 MED ORDER — DICYCLOMINE HCL 10 MG PO CAPS: ORAL_CAPSULE | ORAL | 5 refills | Status: DC

## 2019-07-22 ENCOUNTER — Encounter: Payer: Self-pay | Admitting: Gastroenterology

## 2019-08-05 ENCOUNTER — Encounter: Payer: Self-pay | Admitting: Gastroenterology

## 2019-08-07 DIAGNOSIS — E038 Other specified hypothyroidism: Secondary | ICD-10-CM | POA: Insufficient documentation

## 2019-08-19 ENCOUNTER — Inpatient Hospital Stay (HOSPITAL_COMMUNITY)
Admission: EM | Admit: 2019-08-19 | Discharge: 2019-08-23 | DRG: 683 | Disposition: A | Discharge status: Home / Self Care | Payer: Medicare Other | Attending: Internal Medicine | Admitting: Internal Medicine

## 2019-08-19 ENCOUNTER — Encounter (HOSPITAL_COMMUNITY): Payer: Self-pay | Admitting: *Deleted

## 2019-08-19 DIAGNOSIS — E876 Hypokalemia: Secondary | ICD-10-CM | POA: Diagnosis not present

## 2019-08-19 DIAGNOSIS — N179 Acute kidney failure, unspecified: Secondary | ICD-10-CM | POA: Diagnosis not present

## 2019-08-19 DIAGNOSIS — Z7982 Long term (current) use of aspirin: Secondary | ICD-10-CM

## 2019-08-19 DIAGNOSIS — E785 Hyperlipidemia, unspecified: Secondary | ICD-10-CM | POA: Diagnosis present

## 2019-08-19 DIAGNOSIS — G8929 Other chronic pain: Secondary | ICD-10-CM | POA: Diagnosis present

## 2019-08-19 DIAGNOSIS — Z79890 Hormone replacement therapy (postmenopausal): Secondary | ICD-10-CM

## 2019-08-19 DIAGNOSIS — I130 Hypertensive heart and chronic kidney disease with heart failure and stage 1 through stage 4 chronic kidney disease, or unspecified chronic kidney disease: Secondary | ICD-10-CM | POA: Diagnosis present

## 2019-08-19 DIAGNOSIS — I10 Essential (primary) hypertension: Secondary | ICD-10-CM | POA: Diagnosis present

## 2019-08-19 DIAGNOSIS — E860 Dehydration: Secondary | ICD-10-CM | POA: Diagnosis present

## 2019-08-19 DIAGNOSIS — R338 Other retention of urine: Secondary | ICD-10-CM | POA: Diagnosis present

## 2019-08-19 DIAGNOSIS — Z85038 Personal history of other malignant neoplasm of large intestine: Secondary | ICD-10-CM

## 2019-08-19 DIAGNOSIS — N390 Urinary tract infection, site not specified: Secondary | ICD-10-CM

## 2019-08-19 DIAGNOSIS — G9009 Other idiopathic peripheral autonomic neuropathy: Secondary | ICD-10-CM | POA: Diagnosis present

## 2019-08-19 DIAGNOSIS — K219 Gastro-esophageal reflux disease without esophagitis: Secondary | ICD-10-CM | POA: Diagnosis present

## 2019-08-19 DIAGNOSIS — M109 Gout, unspecified: Secondary | ICD-10-CM | POA: Diagnosis present

## 2019-08-19 DIAGNOSIS — E1122 Type 2 diabetes mellitus with diabetic chronic kidney disease: Secondary | ICD-10-CM | POA: Diagnosis present

## 2019-08-19 DIAGNOSIS — Z800 Family history of malignant neoplasm of digestive organs: Secondary | ICD-10-CM

## 2019-08-19 DIAGNOSIS — Z8371 Family history of colonic polyps: Secondary | ICD-10-CM

## 2019-08-19 DIAGNOSIS — N189 Chronic kidney disease, unspecified: Secondary | ICD-10-CM | POA: Diagnosis present

## 2019-08-19 DIAGNOSIS — G20 Parkinson's disease: Secondary | ICD-10-CM | POA: Diagnosis present

## 2019-08-19 DIAGNOSIS — B9620 Unspecified Escherichia coli [E. coli] as the cause of diseases classified elsewhere: Secondary | ICD-10-CM | POA: Diagnosis present

## 2019-08-19 DIAGNOSIS — J449 Chronic obstructive pulmonary disease, unspecified: Secondary | ICD-10-CM | POA: Diagnosis present

## 2019-08-19 DIAGNOSIS — I272 Pulmonary hypertension, unspecified: Secondary | ICD-10-CM | POA: Diagnosis present

## 2019-08-19 DIAGNOSIS — I5032 Chronic diastolic (congestive) heart failure: Secondary | ICD-10-CM | POA: Diagnosis present

## 2019-08-19 DIAGNOSIS — Z803 Family history of malignant neoplasm of breast: Secondary | ICD-10-CM

## 2019-08-19 DIAGNOSIS — Z20822 Contact with and (suspected) exposure to covid-19: Secondary | ICD-10-CM | POA: Diagnosis present

## 2019-08-19 DIAGNOSIS — E039 Hypothyroidism, unspecified: Secondary | ICD-10-CM | POA: Diagnosis present

## 2019-08-19 DIAGNOSIS — Z8249 Family history of ischemic heart disease and other diseases of the circulatory system: Secondary | ICD-10-CM

## 2019-08-19 DIAGNOSIS — Z885 Allergy status to narcotic agent status: Secondary | ICD-10-CM

## 2019-08-19 DIAGNOSIS — E7800 Pure hypercholesterolemia, unspecified: Secondary | ICD-10-CM | POA: Diagnosis present

## 2019-08-19 LAB — URINALYSIS, ROUTINE W REFLEX MICROSCOPIC
Bilirubin Urine: NEGATIVE
Glucose, UA: NEGATIVE mg/dL
Ketones, ur: NEGATIVE mg/dL
Nitrite: NEGATIVE
Protein, ur: 100 mg/dL — AB
Specific Gravity, Urine: 1.011 (ref 1.005–1.030)
WBC, UA: >50 WBC/hpf — ABNORMAL HIGH (ref 0–5)
pH: 5 (ref 5.0–8.0)

## 2019-08-19 LAB — COMPREHENSIVE METABOLIC PANEL
ALT: 17 U/L (ref 0–44)
AST: 20 U/L (ref 15–41)
Albumin: 2.8 g/dL — ABNORMAL LOW (ref 3.5–5.0)
Alkaline Phosphatase: 94 U/L (ref 38–126)
Anion gap: 14 (ref 5–15)
BUN: 33 mg/dL — ABNORMAL HIGH (ref 8–23)
CO2: 25 mmol/L (ref 22–32)
Calcium: 8.7 mg/dL — ABNORMAL LOW (ref 8.9–10.3)
Chloride: 96 mmol/L — ABNORMAL LOW (ref 98–111)
Creatinine, Ser: 2.41 mg/dL — ABNORMAL HIGH (ref 0.44–1.00)
GFR calc Af Amer: 21 mL/min — ABNORMAL LOW (ref >60)
GFR calc non Af Amer: 18 mL/min — ABNORMAL LOW (ref >60)
Glucose, Bld: 194 mg/dL — ABNORMAL HIGH (ref 70–99)
Potassium: 2.9 mmol/L — ABNORMAL LOW (ref 3.5–5.1)
Sodium: 135 mmol/L (ref 135–145)
Total Bilirubin: 1.3 mg/dL — ABNORMAL HIGH (ref 0.3–1.2)
Total Protein: 6 g/dL — ABNORMAL LOW (ref 6.5–8.1)

## 2019-08-19 LAB — CBC
HCT: 43.2 % (ref 36.0–46.0)
Hemoglobin: 14.1 g/dL (ref 12.0–15.0)
MCH: 29.9 pg (ref 26.0–34.0)
MCHC: 32.6 g/dL (ref 30.0–36.0)
MCV: 91.7 fL (ref 80.0–100.0)
Platelets: 200 10*3/uL (ref 150–400)
RBC: 4.71 MIL/uL (ref 3.87–5.11)
RDW: 14.8 % (ref 11.5–15.5)
WBC: 17.7 10*3/uL — ABNORMAL HIGH (ref 4.0–10.5)
nRBC: 0 % (ref 0.0–0.2)

## 2019-08-19 LAB — POC OCCULT BLOOD, ED: Fecal Occult Bld: NEGATIVE

## 2019-08-19 LAB — LIPASE, BLOOD: Lipase: 14 U/L (ref 11–51)

## 2019-08-19 MED ORDER — SODIUM CHLORIDE 0.9 % IV SOLN
1.0000 g | INTRAVENOUS | Status: DC
  Administered 2019-08-20 – 2019-08-22 (×3): 1 g via INTRAVENOUS
  Filled 2019-08-19: qty 10
  Filled 2019-08-19: qty 1
  Filled 2019-08-19 (×2): qty 10
  Filled 2019-08-19: qty 1

## 2019-08-19 MED ORDER — SODIUM CHLORIDE 0.9 % IV SOLN
1.0000 g | Freq: Once | INTRAVENOUS | Status: AC
  Administered 2019-08-19: 1 g via INTRAVENOUS
  Filled 2019-08-19: qty 10

## 2019-08-19 MED ORDER — ONDANSETRON HCL 4 MG PO TABS: 4.0000 mg | ORAL_TABLET | Freq: Four times a day (QID) | ORAL | Status: DC | PRN

## 2019-08-19 MED ORDER — POTASSIUM CHLORIDE CRYS ER 20 MEQ PO TBCR
40.0000 meq | EXTENDED_RELEASE_TABLET | Freq: Once | ORAL | Status: AC
  Administered 2019-08-19: 40 meq via ORAL
  Filled 2019-08-19: qty 2

## 2019-08-19 MED ORDER — ONDANSETRON HCL 4 MG/2ML IJ SOLN
4.0000 mg | Freq: Four times a day (QID) | INTRAMUSCULAR | Status: DC | PRN
  Administered 2019-08-19 – 2019-08-22 (×2): 4 mg via INTRAVENOUS
  Filled 2019-08-19 (×2): qty 2

## 2019-08-19 MED ORDER — SODIUM CHLORIDE 0.9% FLUSH: 3.0000 mL | Freq: Once | INTRAVENOUS | Status: DC

## 2019-08-19 MED ORDER — SODIUM CHLORIDE 0.9 % IV SOLN: INTRAVENOUS | Status: AC

## 2019-08-19 MED ORDER — SODIUM CHLORIDE 0.9 % IV BOLUS
1000.0000 mL | Freq: Once | INTRAVENOUS | Status: AC
  Administered 2019-08-19: 1000 mL via INTRAVENOUS

## 2019-08-19 NOTE — ED Provider Notes (Signed)
[facility] DEPT Provider Note   CSN: [id number] Arrival date & time: 08/19/19  1853     History Chief Complaint  Patient presents with  . Abdominal Pain  . Urinary Retention    Jacqueline Gamble is a 78 y.o. female.  HPI   She presents for evaluation of lower abdominal pain associated with "dribbling."  She states it started about 4 PM today.  She is a somewhat poor historian.  She is not sure she has had this problem previously.  She denies known sick contacts or other recent illnesses.  Patient is managed for chronic pain, by a physician at [facility] in [place].  There are no other known modifying factors.  Past Medical History:  Diagnosis Date  . Cancer ([place])   . Cataract    removed both eyes  . Colon polyp 2013   TUBULAR ADENOMA  . Common bile duct stone   . Constipation   . Family history of adverse reaction to anesthesia     " It takes a long time for my mother and brother to wake up."  . GERD (gastroesophageal reflux disease)   . Gout   . History of colon cancer 1980  . Hyperlipemia   . Hypertension   . Hypothyroidism   . Pneumonia   . Thyroid disease     Patient Active Problem List   Diagnosis Date Noted  . Other specified hypothyroidism 08/07/2019  . Diet-controlled diabetes mellitus ([place]) 11/30/2017  . 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 Choledocholithiasis   . Common bile duct calculi   . Elevated LFTs   . Fasting hyperglycemia 07/18/2016  . GI bleed 10/29/2015  . Gout 11/13/2013  . Abdominal pain, unspecified site 11/12/2013  . Personal history of colonic polyps 11/11/2013  . Acute cholecystitis with chronic cholecystitis 11/11/2013  . Obesity (BMI 30-39.9) 11/11/2013  . History of colon cancer   . GERD (gastroesophageal reflux disease)   . Hypertension   . Diverticulosis large intestine w/o perforation or abscess w/o bleeding 11/04/2013  . Skin cancer 12/16/2010  . Acute bronchitis  08/20/2008  . Hematuria 10/29/2007  . Other long term (current) drug therapy 08/29/2007  . Thoracic or lumbosacral neuritis or radiculitis, unspecified 03/05/2007  . 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 Obstructive sleep apnea 01/24/2007  . Pulmonary hypertension ([place]) 11/10/2006  . Bacterial pneumonia 08/30/2004  . Gout, arthritis 05/13/2004  . Hypercholesterolemia 03/19/2003  . Stress fracture of metatarsal bone 04/16/2001  . Absolute anemia 02/12/2001  . Edema 11/14/2000  . Idiopathic peripheral autonomic neuropathy 06/26/2000  . 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 ABDOMINAL HYSTERECTOMY    . APPENDECTOMY    . CHOLECYSTECTOMY N/A 11/12/2013   Procedure: LAPAROSCOPIC CHOLECYSTECTOMY WITH INTRAOPERATIVE CHOLANGIOGRAM;  Surgeon: Merrie Roof, MD;  Location: [facility] ORS;  Service: General;  Laterality: N/A;  . COLON SURGERY      2 times/1980 and 1983  . COLONOSCOPY  2013  . ERCP N/A 07/28/2016   Procedure: ENDOSCOPIC RETROGRADE CHOLANGIOPANCREATOGRAPHY (ERCP);  Surgeon: Ladene Artist, MD;  Location: [facility] ENDOSCOPY;  Service: Endoscopy;  Laterality: N/A;  . POLYPECTOMY    . TOTAL VAGINAL HYSTERECTOMY       OB History   No obstetric history on file.     Family History  Problem Relation Age of Onset  . Heart disease Mother   . Colon cancer Maternal Grandfather   .  Colon polyps Son   . Stomach cancer Brother   . Breast cancer Sister   . Esophageal cancer Neg Hx   . Rectal cancer Neg Hx     Social History   Tobacco Use  . Smoking status: Never Smoker  . Smokeless tobacco: Never Used  Substance Use Topics  . Alcohol use: No  . Drug use: No    Home Medications Prior to Admission medications   Medication Sig Start Date End Date Taking? Authorizing Provider  aspirin 81 MG tablet Take 81 mg by mouth daily.   Yes [provider]  atenolol (TENORMIN) 50 MG tablet Take  50 mg by mouth daily. 07/07/19  Yes [provider]  carbidopa-levodopa (SINEMET IR) 25-100 MG tablet Take 1 tablet by mouth 3 (three) times daily.   Yes [provider]  dicyclomine (BENTYL) 10 MG capsule TAKE ONE CAPSULE BY MOUTH THREE TIMES A DAY BEFORE MEALS 01/03/19  Yes Ladene Artist, MD  docusate sodium (STOOL SOFTENER) 100 MG capsule Take 100 mg by mouth daily.   Yes [provider]  irbesartan (AVAPRO) 150 MG tablet Take 150 mg by mouth daily. 06/10/19  Yes [provider]  levothyroxine (SYNTHROID, LEVOTHROID) 125 MCG tablet Take 62.5 mcg by mouth daily before breakfast.   Yes [provider]  lisinopril (PRINIVIL,ZESTRIL) 40 MG tablet Take 40 mg by mouth daily.  04/25/18  Yes [provider]  ondansetron (ZOFRAN) 4 MG tablet Take 1 tablet (4 mg total) by mouth every 6 (six) hours. Patient taking differently: Take 4 mg by mouth every 8 (eight) hours as needed for nausea or vomiting.  04/28/16  Yes Virgel Manifold, MD  pantoprazole (PROTONIX) 40 MG tablet Take 1 tablet (40 mg total) by mouth 2 (two) times daily. 05/25/16  Yes Ladene Artist, MD  torsemide (DEMADEX) 20 MG tablet Take 20 mg by mouth daily.   Yes [provider]    Allergies    Iodine and Codeine  Review of Systems   Review of Systems  All other systems reviewed and are negative.   Physical Exam Updated Vital Signs BP 96/64   Pulse 95   Temp 98.6 F (37 C) (Oral)   Resp 18   SpO2 95%   Physical Exam Vitals and nursing note reviewed.  Constitutional:      General: She is not in acute distress.    Appearance: She is well-developed. She is obese. She is not ill-appearing, toxic-appearing or diaphoretic.  HENT:     Head: Normocephalic and atraumatic.     Right Ear: External ear normal.     Left Ear: External ear normal.  Eyes:     Conjunctiva/sclera: Conjunctivae normal.     Pupils: Pupils are equal, round, and reactive to light.  Neck:      Trachea: Phonation normal.  Cardiovascular:     Rate and Rhythm: Normal rate.  Pulmonary:     Effort: Pulmonary effort is normal.  Abdominal:     General: There is no distension.     Palpations: Abdomen is soft. There is no mass.     Tenderness: There is abdominal tenderness (Suprapubic, mild). There is no guarding or rebound.     Hernia: No hernia is present.  Genitourinary:    Comments: Normal anus.  Small amount of brown stool in rectal vault.  No rectal mass or bleeding. Musculoskeletal:        General: Normal range of motion.     Cervical back:  Normal range of motion and neck supple.  Skin:    General: Skin is warm and dry.  Neurological:     Mental Status: She is alert.     Cranial Nerves: No cranial nerve deficit.     Sensory: No sensory deficit.     Motor: No abnormal muscle tone.     Coordination: Coordination normal.  Psychiatric:        Mood and Affect: Mood normal.        Behavior: Behavior normal.     ED Results / Procedures / Treatments   Labs (all labs ordered are listed, but only abnormal results are displayed) Labs Reviewed  COMPREHENSIVE METABOLIC PANEL - Abnormal; Notable for the following components:      Result Value   Potassium 2.9 (*)    Chloride 96 (*)    Glucose, Bld 194 (*)    BUN 33 (*)    Creatinine, Ser 2.41 (*)    Calcium 8.7 (*)    Total Protein 6.0 (*)    Albumin 2.8 (*)    Total Bilirubin 1.3 (*)    GFR calc non Af Amer 18 (*)    GFR calc Af Amer 21 (*)    All other components within normal limits  CBC - Abnormal; Notable for the following components:   WBC 17.7 (*)    All other components within normal limits  URINALYSIS, ROUTINE W REFLEX MICROSCOPIC - Abnormal; Notable for the following components:   Color, Urine AMBER (*)    APPearance CLOUDY (*)    Hgb urine dipstick MODERATE (*)    Protein, ur 100 (*)    Leukocytes,Ua LARGE (*)    WBC, UA >50 (*)    Bacteria, UA MANY (*)    Non Squamous Epithelial 0-5 (*)    All other  components within normal limits  URINE CULTURE  SARS CORONAVIRUS 2 (TAT 6-24 HRS)  LIPASE, BLOOD  POC OCCULT BLOOD, ED    EKG None  Radiology No results found.  Procedures .Critical Care Performed by: Daleen Bo, MD Authorized by: Daleen Bo, MD   Critical care provider statement:    Critical care time (minutes):  35   Critical care start time:  08/19/2019 7:00 PM   Critical care end time:  08/19/2019 9:43 PM   Critical care time was exclusive of:  Separately billable procedures and treating other patients   Critical care was necessary to treat or prevent imminent or life-threatening deterioration of the following conditions:  Metabolic crisis   Critical care was time spent personally by me on the following activities:  Blood draw for specimens, development of treatment plan with patient or surrogate, discussions with consultants, evaluation of patient's response to treatment, examination of patient, obtaining history from patient or surrogate, ordering and performing treatments and interventions, ordering and review of laboratory studies, pulse oximetry, re-evaluation of patient's condition, review of old charts and ordering and review of radiographic studies   (including critical care time)  Medications Ordered in ED Medications  sodium chloride flush (NS) 0.9 % injection 3 mL (3 mLs Intravenous Not Given 08/19/19 1908)  cefTRIAXone (ROCEPHIN) 1 g in sodium chloride 0.9 % 100 mL IVPB (has no administration in time range)  sodium chloride 0.9 % bolus 1,000 mL (0 mLs Intravenous Stopped 08/19/19 2136)  potassium chloride SA (KLOR-CON) CR tablet 40 mEq (40 mEq Oral Given 08/19/19 2146)    ED Course  I have reviewed the triage vital signs and the nursing notes.  Pertinent labs & imaging results that were available during my care of the patient were reviewed by me and considered in my medical decision making (see chart for details).  Clinical Course as of Aug 18 2152   Mon Aug 19, 2019  2022 Normal except white count high  CBC(!) [EW]  2022 Normal except potassium low, chloride low, glucose high, BUN high, creatinine high, calcium low, protein low, albumin low, total bilirubin high, GFR low  Comprehensive metabolic panel(!) [EW]  8921 Normal  POC occult blood, ED Provider will collect [EW]  2126 Normal  Lipase, blood [EW]  2126 Normal except presence of hemoglobin, protein, leukocytes, red cells, white cells, bacteria  Urinalysis, Routine w reflex microscopic(!) [EW]  2135 I was able to reach both the patient's husband who brought the patient to the emergency department, he is sitting in a car outside; I was also able to reach the patient's sister.  They both state that the patient is not been eating well for about a week, had some vomiting, multiple episodes of diarrhea and has been having difficulty walking because of weakness.  They understand that she will require hospitalization for further treatment.   [EW]    Clinical Course User Index [EW] Daleen Bo, MD   MDM Rules/Calculators/A&P                       Patient Vitals for the past 24 hrs:  BP Temp Temp src Pulse Resp SpO2  08/19/19 2130 96/64 - - 95 18 95 %  08/19/19 2045 98/64 - - 97 - 96 %  08/19/19 1958 107/65 - - 100 18 94 %  08/19/19 1855 120/71 98.6 F (37 C) Oral (!) 104 18 93 %    9:43 PM Reevaluation with update and discussion. After initial assessment and treatment, an updated evaluation reveals she remains alert and comfortable, findings discussed with the patient and all questions were answered. Daleen Bo   Medical Decision Making: Decreased urinary output secondary to acute kidney injury.  Nonspecific vomiting and diarrhea leading to elevation of creatinine.  Also potassium low likely from diarrhea.  Patient likely chronically debilitated with low protein and albumin.  As above urinary tract infection with abnormal urinalysis and elevated white count.  Doubt severe  sepsis or impending vascular collapse.  She requires hospitalization for further treatment.  CRITICAL CARE-yes Performed by: Daleen Bo   Nursing Notes Reviewed/ Care Coordinated Applicable Imaging Reviewed Interpretation of Laboratory Data incorporated into ED treatment  9:44 PM-Consult complete with hospitalist. Patient case explained and discussed.  She agrees to admit patient for further evaluation and treatment. Call ended at 9:54 PM  Plan: Admit   Final Clinical Impression(s) / ED Diagnoses Final diagnoses:  Acute kidney injury ([place])  Hypokalemia  Urinary tract infection without hematuria, site unspecified    Rx / DC Orders ED Discharge Orders    None       Daleen Bo, MD 08/19/19 2311

## 2019-08-19 NOTE — H&P (Signed)
History and Physical    Jacqueline Gamble DXA:[id number] DOB: [birth year] DOA: 08/19/2019  PCP: Galen Manila, MD  Patient coming from: Home  Chief Complaint: Cannot urinate  HPI: Jacqueline Gamble is a 78 y.o. female with medical history significant of hypertension comes in with a day of inability to urinate.  She reports she cannot urinate feels like she needs to she is dribbling.  She has not been feeling well has been nauseous and vomiting for a week.  She denies any fevers at home.  She has not been eating and drinking very well.  Patient found have a UTI in the emergency department Foley catheter has been placed.  Patient febrile in the emergency department.  Patient referred for admission for UTI and new urinary retention.  She has mild acute kidney injury with a creatinine of 2.4 however do not have a recent labs on her in the last 3 years.  Review of Systems: As per HPI otherwise 10 point review of systems negative.   Past Medical History:  Diagnosis Date  . Cancer ([place])   . Cataract    removed both eyes  . Colon polyp 2013   TUBULAR ADENOMA  . Common bile duct stone   . Constipation   . Family history of adverse reaction to anesthesia     " It takes a long time for my mother and brother to wake up."  . GERD (gastroesophageal reflux disease)   . Gout   . History of colon cancer 1980  . Hyperlipemia   . Hypertension   . Hypothyroidism   . Pneumonia   . Thyroid disease     Past Surgical History:  Procedure Laterality Date  . ABDOMINAL HYSTERECTOMY    . APPENDECTOMY    . CHOLECYSTECTOMY N/A 11/12/2013   Procedure: LAPAROSCOPIC CHOLECYSTECTOMY WITH INTRAOPERATIVE CHOLANGIOGRAM;  Surgeon: Merrie Roof, MD;  Location: [facility] ORS;  Service: General;  Laterality: N/A;  . COLON SURGERY      2 times/1980 and 1983  . COLONOSCOPY  2013  . ERCP N/A 07/28/2016   Procedure: ENDOSCOPIC RETROGRADE CHOLANGIOPANCREATOGRAPHY (ERCP);  Surgeon: Ladene Artist, MD;  Location[facility] ENDOSCOPY;   Service: Endoscopy;  Laterality: N/A;  . POLYPECTOMY    . TOTAL VAGINAL HYSTERECTOMY       reports that she has never smoked. She has never used smokeless tobacco. She reports that she does not drink alcohol or use drugs.  Allergies  Allergen Reactions  . Iodine Swelling  . Codeine Itching and Rash    Family History  Problem Relation Age of Onset  . Heart disease Mother   . Colon cancer Maternal Grandfather   . Colon polyps Son   . Stomach cancer Brother   . Breast cancer Sister   . Esophageal cancer Neg Hx   . Rectal cancer Neg Hx     Prior to Admission medications   Medication Sig Start Date End Date Taking? Authorizing Provider  aspirin 81 MG tablet Take 81 mg by mouth daily.   Yes [provider]  atenolol (TENORMIN) 50 MG tablet Take 50 mg by mouth daily. 07/07/19  Yes [provider]  carbidopa-levodopa (SINEMET IR) 25-100 MG tablet Take 1 tablet by mouth 3 (three) times daily.   Yes [provider]  dicyclomine (BENTYL) 10 MG capsule TAKE ONE CAPSULE BY MOUTH THREE TIMES A DAY BEFORE MEALS 01/03/19  Yes Ladene Artist, MD  docusate sodium (STOOL SOFTENER) 100 MG capsule Take 100 mg  by mouth daily.   Yes [provider]  irbesartan (AVAPRO) 150 MG tablet Take 150 mg by mouth daily. 06/10/19  Yes [provider]  levothyroxine (SYNTHROID, LEVOTHROID) 125 MCG tablet Take 62.5 mcg by mouth daily before breakfast.   Yes [provider]  lisinopril (PRINIVIL,ZESTRIL) 40 MG tablet Take 40 mg by mouth daily.  04/25/18  Yes [provider]  ondansetron (ZOFRAN) 4 MG tablet Take 1 tablet (4 mg total) by mouth every 6 (six) hours. Patient taking differently: Take 4 mg by mouth every 8 (eight) hours as needed for nausea or vomiting.  04/28/16  Yes Virgel Manifold, MD  pantoprazole (PROTONIX) 40 MG tablet Take 1 tablet (40 mg total) by mouth 2 (two) times daily. 05/25/16  Yes Ladene Artist, MD  torsemide (DEMADEX) 20 MG  tablet Take 20 mg by mouth daily.   Yes [provider]    Physical Exam: Vitals:   08/19/19 1855 08/19/19 1958 08/19/19 2045 08/19/19 2130  BP: 120/71 107/65 98/64 96/64   Pulse: (!) 104 100 97 95  Resp: 18 18  18   Temp: 98.6 F (37 C)     TempSrc: Oral     SpO2: 93% 94% 96% 95%      Constitutional: NAD, calm, comfortable Vitals:   08/19/19 1855 08/19/19 1958 08/19/19 2045 08/19/19 2130  BP: 120/71 107/65 98/64 96/64   Pulse: (!) 104 100 97 95  Resp: 18 18  18   Temp: 98.6 F (37 C)     TempSrc: Oral     SpO2: 93% 94% 96% 95%   Eyes: PERRL, lids and conjunctivae normal ENMT: Mucous membranes are moist. Posterior pharynx clear of any exudate or lesions.Normal dentition.  Neck: normal, supple, no masses, no thyromegaly Respiratory: clear to auscultation bilaterally, no wheezing, no crackles. Normal respiratory effort. No accessory muscle use.  Cardiovascular: Regular rate and rhythm, no murmurs / rubs / gallops. No extremity edema. 2+ pedal pulses. No carotid bruits.  Abdomen: no tenderness, no masses palpated. No hepatosplenomegaly. Bowel sounds positive.  Musculoskeletal: no clubbing / cyanosis. No joint deformity upper and lower extremities. Good ROM, no contractures. Normal muscle tone.  Skin: no rashes, lesions, ulcers. No induration Neurologic: CN 2-12 grossly intact. Sensation intact, DTR normal. Strength 5/5 in all 4.  Psychiatric: Normal judgment and insight. Alert and oriented x 3. Normal mood.    Labs on Admission: I have personally reviewed following labs and imaging studies  CBC: Recent Labs  Lab 08/19/19 1940  WBC 17.7*  HGB 14.1  HCT 43.2  MCV 91.7  PLT 098   Basic Metabolic Panel: Recent Labs  Lab 08/19/19 1940  NA 135  K 2.9*  CL 96*  CO2 25  GLUCOSE 194*  BUN 33*  CREATININE 2.41*  CALCIUM 8.7*   GFR: CrCl cannot be calculated (Unknown ideal weight.). Liver Function Tests: Recent Labs  Lab 08/19/19 1940  AST 20  ALT 17    ALKPHOS 94  BILITOT 1.3*  PROT 6.0*  ALBUMIN 2.8*   Recent Labs  Lab 08/19/19 1940  LIPASE 14   No results for input(s): AMMONIA in the last 168 hours. Coagulation Profile: No results for input(s): INR, PROTIME in the last 168 hours. Cardiac Enzymes: No results for input(s): CKTOTAL, CKMB, CKMBINDEX, TROPONINI in the last 168 hours. BNP (last 3 results) No results for input(s): PROBNP in the last 8760 hours. HbA1C: No results for input(s): HGBA1C in the last 72 hours. CBG: No results for input(s): GLUCAP in the  last 168 hours. Lipid Profile: No results for input(s): CHOL, HDL, LDLCALC, TRIG, CHOLHDL, LDLDIRECT in the last 72 hours. Thyroid Function Tests: No results for input(s): TSH, T4TOTAL, FREET4, T3FREE, THYROIDAB in the last 72 hours. Anemia Panel: No results for input(s): VITAMINB12, FOLATE, FERRITIN, TIBC, IRON, RETICCTPCT in the last 72 hours. Urine analysis:    Component Value Date/Time   COLORURINE AMBER (A) 08/19/2019 1901   APPEARANCEUR CLOUDY (A) 08/19/2019 1901   LABSPEC 1.011 08/19/2019 1901   PHURINE 5.0 08/19/2019 1901   GLUCOSEU NEGATIVE 08/19/2019 1901   HGBUR MODERATE (A) 08/19/2019 1901   BILIRUBINUR NEGATIVE 08/19/2019 1901   KETONESUR NEGATIVE 08/19/2019 1901   PROTEINUR 100 (A) 08/19/2019 1901   NITRITE NEGATIVE 08/19/2019 1901   LEUKOCYTESUR LARGE (A) 08/19/2019 1901   Sepsis Labs: !!!!!!!!!!!!!!!!!!!!!!!!!!!!!!!!!!!!!!!!!!!! @LABRCNTIP (procalcitonin:4,lacticidven:4) )No results found for this or any previous visit (from the past 240 hour(s)).   Radiological Exams on Admission: No results found.  Old chart reviewed Case discussed with EDP Dr. Eulis Foster  Assessment/Plan 78 year old female with urinary retention and acute UTI Principal Problem:   Acute lower UTI-vital signs stable.  Placed on Rocephin IV.  Obtain urine culture.  Follow urinary output.  Active Problems:   Acute urinary retention-due to infection as above    AKI (acute  kidney injury) (HCC)-creatinine up to 2.4 unknown what her baseline is.  Gentle IV fluids and repeat in the morning.    Hypertension-blood pressure soft at this time will hold blood pressure meds at this time.   Covid screen is pending     DVT prophylaxis: SCDs Code Status: Full Family Communication: None Disposition Plan: 1 to 2 days Consults called: None Admission status: Observation   Secundino Ellithorpe A MD [facility]  If 7PM-7AM, please contact night-coverage [email or web address] Password [facility]  08/19/2019, 9:59 PM

## 2019-08-19 NOTE — ED Notes (Signed)
Bladder scan showed 62 mL of urine

## 2019-08-19 NOTE — ED Triage Notes (Signed)
Pt states she has had abd pain and been unable to urinate since 5PM yesterday. She says she has been dribbling a little bit of urine. Pt states she has felt nauseas and unable to eat for the past 3 days. Pt fell this morning, she was sitting on the edge. She denies loss of consciousness or head injury.

## 2019-08-20 ENCOUNTER — Encounter (HOSPITAL_COMMUNITY): Payer: Self-pay | Admitting: Family Medicine

## 2019-08-20 ENCOUNTER — Observation Stay (HOSPITAL_COMMUNITY): Payer: Medicare Other

## 2019-08-20 DIAGNOSIS — K219 Gastro-esophageal reflux disease without esophagitis: Secondary | ICD-10-CM | POA: Diagnosis present

## 2019-08-20 DIAGNOSIS — E785 Hyperlipidemia, unspecified: Secondary | ICD-10-CM | POA: Diagnosis present

## 2019-08-20 DIAGNOSIS — Z8371 Family history of colonic polyps: Secondary | ICD-10-CM | POA: Diagnosis not present

## 2019-08-20 DIAGNOSIS — N390 Urinary tract infection, site not specified: Secondary | ICD-10-CM | POA: Diagnosis present

## 2019-08-20 DIAGNOSIS — E876 Hypokalemia: Secondary | ICD-10-CM | POA: Diagnosis present

## 2019-08-20 DIAGNOSIS — Z20822 Contact with and (suspected) exposure to covid-19: Secondary | ICD-10-CM | POA: Diagnosis present

## 2019-08-20 DIAGNOSIS — Z800 Family history of malignant neoplasm of digestive organs: Secondary | ICD-10-CM | POA: Diagnosis not present

## 2019-08-20 DIAGNOSIS — N189 Chronic kidney disease, unspecified: Secondary | ICD-10-CM | POA: Diagnosis present

## 2019-08-20 DIAGNOSIS — M109 Gout, unspecified: Secondary | ICD-10-CM | POA: Diagnosis present

## 2019-08-20 DIAGNOSIS — I5033 Acute on chronic diastolic (congestive) heart failure: Secondary | ICD-10-CM | POA: Diagnosis not present

## 2019-08-20 DIAGNOSIS — I272 Pulmonary hypertension, unspecified: Secondary | ICD-10-CM | POA: Diagnosis present

## 2019-08-20 DIAGNOSIS — G8929 Other chronic pain: Secondary | ICD-10-CM | POA: Diagnosis present

## 2019-08-20 DIAGNOSIS — I130 Hypertensive heart and chronic kidney disease with heart failure and stage 1 through stage 4 chronic kidney disease, or unspecified chronic kidney disease: Secondary | ICD-10-CM | POA: Diagnosis present

## 2019-08-20 DIAGNOSIS — Z885 Allergy status to narcotic agent status: Secondary | ICD-10-CM | POA: Diagnosis not present

## 2019-08-20 DIAGNOSIS — I5032 Chronic diastolic (congestive) heart failure: Secondary | ICD-10-CM | POA: Diagnosis present

## 2019-08-20 DIAGNOSIS — Z85038 Personal history of other malignant neoplasm of large intestine: Secondary | ICD-10-CM | POA: Diagnosis not present

## 2019-08-20 DIAGNOSIS — G9009 Other idiopathic peripheral autonomic neuropathy: Secondary | ICD-10-CM | POA: Diagnosis present

## 2019-08-20 DIAGNOSIS — E039 Hypothyroidism, unspecified: Secondary | ICD-10-CM | POA: Diagnosis present

## 2019-08-20 DIAGNOSIS — N179 Acute kidney failure, unspecified: Secondary | ICD-10-CM | POA: Diagnosis present

## 2019-08-20 DIAGNOSIS — J449 Chronic obstructive pulmonary disease, unspecified: Secondary | ICD-10-CM | POA: Diagnosis present

## 2019-08-20 DIAGNOSIS — Z803 Family history of malignant neoplasm of breast: Secondary | ICD-10-CM | POA: Diagnosis not present

## 2019-08-20 DIAGNOSIS — Z7982 Long term (current) use of aspirin: Secondary | ICD-10-CM | POA: Diagnosis not present

## 2019-08-20 DIAGNOSIS — Z8249 Family history of ischemic heart disease and other diseases of the circulatory system: Secondary | ICD-10-CM | POA: Diagnosis not present

## 2019-08-20 DIAGNOSIS — Z79890 Hormone replacement therapy (postmenopausal): Secondary | ICD-10-CM | POA: Diagnosis not present

## 2019-08-20 DIAGNOSIS — E7800 Pure hypercholesterolemia, unspecified: Secondary | ICD-10-CM | POA: Diagnosis present

## 2019-08-20 LAB — BASIC METABOLIC PANEL
Anion gap: 13 (ref 5–15)
BUN: 40 mg/dL — ABNORMAL HIGH (ref 8–23)
CO2: 23 mmol/L (ref 22–32)
Calcium: 8.2 mg/dL — ABNORMAL LOW (ref 8.9–10.3)
Chloride: 100 mmol/L (ref 98–111)
Creatinine, Ser: 2.69 mg/dL — ABNORMAL HIGH (ref 0.44–1.00)
GFR calc Af Amer: 19 mL/min — ABNORMAL LOW (ref >60)
GFR calc non Af Amer: 16 mL/min — ABNORMAL LOW (ref >60)
Glucose, Bld: 161 mg/dL — ABNORMAL HIGH (ref 70–99)
Potassium: 3.9 mmol/L (ref 3.5–5.1)
Sodium: 136 mmol/L (ref 135–145)

## 2019-08-20 LAB — CBC
HCT: 40.9 % (ref 36.0–46.0)
Hemoglobin: 13.1 g/dL (ref 12.0–15.0)
MCH: 30 pg (ref 26.0–34.0)
MCHC: 32 g/dL (ref 30.0–36.0)
MCV: 93.6 fL (ref 80.0–100.0)
Platelets: 187 10*3/uL (ref 150–400)
RBC: 4.37 MIL/uL (ref 3.87–5.11)
RDW: 15.1 % (ref 11.5–15.5)
WBC: 16.5 10*3/uL — ABNORMAL HIGH (ref 4.0–10.5)
nRBC: 0 % (ref 0.0–0.2)

## 2019-08-20 MED ORDER — DICYCLOMINE HCL 10 MG PO CAPS: 10.0000 mg | ORAL_CAPSULE | Freq: Three times a day (TID) | ORAL | Status: DC | PRN

## 2019-08-20 MED ORDER — SODIUM CHLORIDE 0.9 % IV SOLN: INTRAVENOUS | Status: AC

## 2019-08-20 MED ORDER — ENOXAPARIN SODIUM 30 MG/0.3ML ~~LOC~~ SOLN
30.0000 mg | SUBCUTANEOUS | Status: DC
  Administered 2019-08-20 – 2019-08-22 (×3): 30 mg via SUBCUTANEOUS
  Filled 2019-08-20 (×3): qty 0.3

## 2019-08-20 MED ORDER — LEVOTHYROXINE SODIUM 50 MCG PO TABS
62.5000 ug | ORAL_TABLET | Freq: Every day | ORAL | Status: DC
  Administered 2019-08-20 – 2019-08-23 (×4): 62.5 ug via ORAL
  Filled 2019-08-20: qty 0.5
  Filled 2019-08-20: qty 1
  Filled 2019-08-20: qty 0.5
  Filled 2019-08-20 (×2): qty 1

## 2019-08-20 MED ORDER — CARBIDOPA-LEVODOPA 25-100 MG PO TABS
1.0000 | ORAL_TABLET | Freq: Three times a day (TID) | ORAL | Status: DC
  Administered 2019-08-20 – 2019-08-23 (×10): 1 via ORAL
  Filled 2019-08-20 (×11): qty 1

## 2019-08-20 MED ORDER — ATENOLOL 50 MG PO TABS
50.0000 mg | ORAL_TABLET | Freq: Every day | ORAL | Status: DC
  Administered 2019-08-20 – 2019-08-23 (×4): 50 mg via ORAL
  Filled 2019-08-20 (×4): qty 1

## 2019-08-20 MED ORDER — PANTOPRAZOLE SODIUM 40 MG PO TBEC
40.0000 mg | DELAYED_RELEASE_TABLET | Freq: Two times a day (BID) | ORAL | Status: DC
  Administered 2019-08-20 – 2019-08-23 (×7): 40 mg via ORAL
  Filled 2019-08-20 (×7): qty 1

## 2019-08-20 MED ORDER — ASPIRIN EC 81 MG PO TBEC
81.0000 mg | DELAYED_RELEASE_TABLET | Freq: Every day | ORAL | Status: DC
  Administered 2019-08-20 – 2019-08-23 (×4): 81 mg via ORAL
  Filled 2019-08-20 (×4): qty 1

## 2019-08-20 NOTE — ED Notes (Signed)
Patient transported to CT 

## 2019-08-20 NOTE — ED Notes (Signed)
Patient taken to restroom in wheelchair to have BM.

## 2019-08-20 NOTE — ED Notes (Signed)
Patient given breakfast tray.

## 2019-08-20 NOTE — Progress Notes (Signed)
PROGRESS NOTE    Jacqueline Gamble  VEL:[id number] DOB: [birth year] DOA: 08/19/2019 PCP: Galen Manila, MD  Brief Narrative: This is a 78 year old female with history of Parkinson's disease, diet-controlled diabetes, prior history of colon cancer, GERD who presents to the emergency room with difficulty urinating, lower abdominal pain and discomfort for 2 to 3 days. -Patient is a very poor historian, tells me she has no underlying medical problems and I was able to ask her about Parkinson's and diabetes based on her chart and meds only, she denies any fever or chills, does report that she has had lower abdominal discomfort that worsened over few days and yesterday she was unable to urinate at all hence presented to the ED, bladder scan only noted 62 mL of urine, work-up in the emergency room was notable for creatinine of 2.6, leukocytosis with white count of 17,000, hypokalemia, abnormal urinalysis with cloudy urine, many bacteria, greater than 50 WBCs, large leukocyte esterase.   Assessment & Plan:   UTI -IV ceftriaxone, follow-up urine culture -Monitor urine output closely, rest as below  Acute kidney injury versus progressive CKD -Baseline kidney function is unknown, creatinine was normal in 2017 --Possibly with some oliguria at this time, - will check noncontrast CT abdomen pelvis, to rule out hydronephrosis also rule out any lower pelvic pathology, she is also on torsemide, lisinopril and ARB which have all been held -Urinalysis concerning for UTI as well -Continue IV ceftriaxone, follow-up urine culture -Monitor urine output closely, she has a pure wick now, discussed with staff RN to do intermittent bladder scans -Increase normal saline to 100 cc an hour  Parkinson's disease -Resume Sinemet  Hypertension -Possibly CHF -Patient is on aspirin, atenolol, ACE/ARB and Demadex at baseline, unable to tell me if she has congestive heart failure, suspect some cognitive dysfunction, will  check 2D echocardiogram for baseline, none in our system -She is clinically dry at this time, continue IV fluids for 1 day  Remote history of colon cancer -History of remote sigmoid colectomy, follow-up with GI    Suspected cognitive dysfunction -Monitor with treatment of above  DVT prophylaxis: Add Lovenox Code Status: Full code Family Communication: No family at bedside, left voicemail message for Sister Thereasa Solo Disposition Plan: To be determined    Procedures:   Antimicrobials:    Subjective: -Very poor historian, she tells me she had difficulty urinating for the last few days and also reports lower abdominal discomfort  Objective: Vitals:   08/20/19 0630 08/20/19 0700 08/20/19 0800 08/20/19 0930  BP: 117/67 112/62 119/76 (!) 109/53  Pulse: (!) 101 (!) 101 99 97  Resp: 17 16 16 16   Temp:      TempSrc:      SpO2: 95% 93% 95% 95%    Intake/Output Summary (Last 24 hours) at 08/20/2019 1034 Last data filed at 08/19/2019 2057 Gross per 24 hour  Intake --  Output 125 ml  Net -125 ml   There were no vitals filed for this visit.  Examination:  General exam: Obese elderly female, laying in bed, awake alert oriented to self and partly to place only  respiratory system: Decreased breath sounds bilaterally Cardiovascular system: S1 & S2 heard, RRR.  Gastrointestinal system: Soft, nondistended, mild lower abdominal/pelvic tenderness, bowel sounds present Central nervous system: Mild cognitive deficits, moves all extremities, no no localizing signs Extremities: No edema, tremors noted in both upper extremities Skin: No rashes, lesions or ulcers Psychiatry: Poor insight and judgment   Data Reviewed:  CBC: Recent Labs  Lab 08/19/19 1940 08/20/19 0515  WBC 17.7* 16.5*  HGB 14.1 13.1  HCT 43.2 40.9  MCV 91.7 93.6  PLT 200 622   Basic Metabolic Panel: Recent Labs  Lab 08/19/19 1940 08/20/19 0515  NA 135 136  K 2.9* 3.9  CL 96* 100  CO2 25 23  GLUCOSE  194* 161*  BUN 33* 40*  CREATININE 2.41* 2.69*  CALCIUM 8.7* 8.2*   GFR: CrCl cannot be calculated (Unknown ideal weight.). Liver Function Tests: Recent Labs  Lab 08/19/19 1940  AST 20  ALT 17  ALKPHOS 94  BILITOT 1.3*  PROT 6.0*  ALBUMIN 2.8*   Recent Labs  Lab 08/19/19 1940  LIPASE 14   No results for input(s): AMMONIA in the last 168 hours. Coagulation Profile: No results for input(s): INR, PROTIME in the last 168 hours. Cardiac Enzymes: No results for input(s): CKTOTAL, CKMB, CKMBINDEX, TROPONINI in the last 168 hours. BNP (last 3 results) No results for input(s): PROBNP in the last 8760 hours. HbA1C: No results for input(s): HGBA1C in the last 72 hours. CBG: No results for input(s): GLUCAP in the last 168 hours. Lipid Profile: No results for input(s): CHOL, HDL, LDLCALC, TRIG, CHOLHDL, LDLDIRECT in the last 72 hours. Thyroid Function Tests: No results for input(s): TSH, T4TOTAL, FREET4, T3FREE, THYROIDAB in the last 72 hours. Anemia Panel: No results for input(s): VITAMINB12, FOLATE, FERRITIN, TIBC, IRON, RETICCTPCT in the last 72 hours. Urine analysis:    Component Value Date/Time   COLORURINE AMBER (A) 08/19/2019 1901   APPEARANCEUR CLOUDY (A) 08/19/2019 1901   LABSPEC 1.011 08/19/2019 1901   PHURINE 5.0 08/19/2019 1901   GLUCOSEU NEGATIVE 08/19/2019 1901   HGBUR MODERATE (A) 08/19/2019 1901   BILIRUBINUR NEGATIVE 08/19/2019 1901   KETONESUR NEGATIVE 08/19/2019 1901   PROTEINUR 100 (A) 08/19/2019 1901   NITRITE NEGATIVE 08/19/2019 1901   LEUKOCYTESUR LARGE (A) 08/19/2019 1901   Sepsis Labs: @LABRCNTIP (procalcitonin:4,lacticidven:4)  )No results found for this or any previous visit (from the past 240 hour(s)).       Radiology Studies: No results found.      Scheduled Meds: . aspirin  81 mg Oral Daily  . atenolol  50 mg Oral Daily  . carbidopa-levodopa  1 tablet Oral TID  . [START ON 08/21/2019] levothyroxine  62.5 mcg Oral QAC breakfast   . pantoprazole  40 mg Oral BID  . sodium chloride flush  3 mL Intravenous Once   Continuous Infusions: . sodium chloride    . sodium chloride    . sodium chloride    . cefTRIAXone (ROCEPHIN)  IV       LOS: 0 days    Time spent:1min  Domenic Polite, MD [facility]  08/20/2019, 10:34 AM

## 2019-08-20 NOTE — ED Notes (Signed)
ED TO INPATIENT HANDOFF REPORT  Name/Age/Gender Jacqueline Gamble 78 y.o. female  Code Status    Code Status Orders  (From admission, onward)         Start     Ordered   08/19/19 2158  Full code  Continuous     08/19/19 2158        Code Status History    Date Active Date Inactive Code Status Order ID Comments User Context   11/11/2013 2149 11/13/2013 1943 Full Code [id number]  Michael Boston, MD ED   Advance Care Planning Activity      Home/SNF/Other Home  Chief Complaint UTI (urinary tract infection) [N39.0]  Level of Care/Admitting Diagnosis ED Disposition    ED Disposition Condition [facility]: [place] [[id number]]  Level of Care: Med-Surg [16]  Covid Evaluation: Symptomatic Person Under Investigation (PUI)  Diagnosis: UTI (urinary tract infection) [983382]  Admitting Physician: Domenic Polite [[id number]]  Attending Physician: Domenic Polite [[id number]]  Estimated length of stay: past midnight tomorrow  Certification:: I certify this patient will need inpatient services for at least 2 midnights       Medical History Past Medical History:  Diagnosis Date  . Cancer ([place])   . Cataract    removed both eyes  . Colon polyp 2013   TUBULAR ADENOMA  . Common bile duct stone   . Constipation   . Family history of adverse reaction to anesthesia     " It takes a long time for my mother and brother to wake up."  . GERD (gastroesophageal reflux disease)   . Gout   . History of colon cancer 1980  . Hyperlipemia   . Hypertension   . Hypothyroidism   . Pneumonia   . Thyroid disease     Allergies Allergies  Allergen Reactions  . Iodine Swelling  . Codeine Itching and Rash    IV Location/Drains/Wounds Patient Lines/Drains/Airways Status   Active Line/Drains/Airways    Name:   Placement date:   Placement time:   Site:   Days:   Peripheral IV 08/19/19 Left Antecubital   08/19/19    1940    Antecubital   1   Incision (Closed)  11/12/13 Abdomen Other (Comment)   11/12/13    1335     2107   Incision - 4 Ports Abdomen 1: Lower;Right 2: Superior 3: Right;Medial 4: Right;Upper   11/12/13    1231     2107          Labs/Imaging Results for orders placed or performed during the hospital encounter of 08/19/19 (from the past 48 hour(s))  Urinalysis, Routine w reflex microscopic     Status: Abnormal   Collection Time: 08/19/19  7:01 PM  Result Value Ref Range   Color, Urine AMBER (A) YELLOW    Comment: BIOCHEMICALS MAY BE AFFECTED BY COLOR   APPearance CLOUDY (A) CLEAR   Specific Gravity, Urine 1.011 1.005 - 1.030   pH 5.0 5.0 - 8.0   Glucose, UA NEGATIVE NEGATIVE mg/dL   Hgb urine dipstick MODERATE (A) NEGATIVE   Bilirubin Urine NEGATIVE NEGATIVE   Ketones, ur NEGATIVE NEGATIVE mg/dL   Protein, ur 100 (A) NEGATIVE mg/dL   Nitrite NEGATIVE NEGATIVE   Leukocytes,Ua LARGE (A) NEGATIVE   RBC / HPF 11-20 0 - 5 RBC/hpf   WBC, UA >50 (H) 0 - 5 WBC/hpf   Bacteria, UA MANY (A) NONE SEEN   Squamous Epithelial / LPF 11-20 0 -  5   WBC Clumps PRESENT    Amorphous Crystal PRESENT    Non Squamous Epithelial 0-5 (A) NONE SEEN    Comment: Performed at [facility], [place] [address], [place], [place] [address]  Lipase, blood     Status: None   Collection Time: 08/19/19  7:40 PM  Result Value Ref Range   Lipase 14 11 - 51 U/L    Comment: Performed at [facility], [place] [address] [place], [place] [address]  Comprehensive metabolic panel     Status: Abnormal   Collection Time: 08/19/19  7:40 PM  Result Value Ref Range   Sodium 135 135 - 145 mmol/L   Potassium 2.9 (L) 3.5 - 5.1 mmol/L   Chloride 96 (L) 98 - 111 mmol/L   CO2 25 22 - 32 mmol/L   Glucose, Bld 194 (H) 70 - 99 mg/dL   BUN 33 (H) 8 - 23 mg/dL   Creatinine, Ser 2.41 (H) 0.44 - 1.00 mg/dL   Calcium 8.7 (L) 8.9 - 10.3 mg/dL   Total Protein 6.0 (L) 6.5 - 8.1 g/dL   Albumin 2.8 (L) 3.5 - 5.0 g/dL   AST 20 15 - 41 U/L   ALT 17 0  - 44 U/L   Alkaline Phosphatase 94 38 - 126 U/L   Total Bilirubin 1.3 (H) 0.3 - 1.2 mg/dL   GFR calc non Af Amer 18 (L) >60 mL/min   GFR calc Af Amer 21 (L) >60 mL/min   Anion gap 14 5 - 15    Comment: Performed at [facility], [place] [address] [place], [place] [address]  CBC     Status: Abnormal   Collection Time: 08/19/19  7:40 PM  Result Value Ref Range   WBC 17.7 (H) 4.0 - 10.5 K/uL   RBC 4.71 3.87 - 5.11 MIL/uL   Hemoglobin 14.1 12.0 - 15.0 g/dL   HCT 43.2 36.0 - 46.0 %   MCV 91.7 80.0 - 100.0 fL   MCH 29.9 26.0 - 34.0 pg   MCHC 32.6 30.0 - 36.0 g/dL   RDW 14.8 11.5 - 15.5 %   Platelets 200 150 - 400 K/uL   nRBC 0.0 0.0 - 0.2 %    Comment: Performed at [facility], [place] [address], [place], [place] [address]  POC occult blood, ED Provider will collect     Status: None   Collection Time: 08/19/19  9:19 PM  Result Value Ref Range   Fecal Occult Bld NEGATIVE NEGATIVE  CBC     Status: Abnormal   Collection Time: 08/20/19  5:15 AM  Result Value Ref Range   WBC 16.5 (H) 4.0 - 10.5 K/uL   RBC 4.37 3.87 - 5.11 MIL/uL   Hemoglobin 13.1 12.0 - 15.0 g/dL   HCT 40.9 36.0 - 46.0 %   MCV 93.6 80.0 - 100.0 fL   MCH 30.0 26.0 - 34.0 pg   MCHC 32.0 30.0 - 36.0 g/dL   RDW 15.1 11.5 - 15.5 %   Platelets 187 150 - 400 K/uL   nRBC 0.0 0.0 - 0.2 %    Comment: Performed at [facility], [place] [address] [place], [place] [address]  Basic metabolic panel     Status: Abnormal   Collection Time: 08/20/19  5:15 AM  Result Value Ref Range   Sodium 136 135 - 145 mmol/L   Potassium 3.9 3.5 - 5.1 mmol/L    Comment: DELTA CHECK NOTED  Chloride 100 98 - 111 mmol/L   CO2 23 22 - 32 mmol/L   Glucose, Bld 161 (H) 70 - 99 mg/dL   BUN 40 (H) 8 - 23 mg/dL   Creatinine, Ser 2.69 (H) 0.44 - 1.00 mg/dL   Calcium 8.2 (L) 8.9 - 10.3 mg/dL   GFR calc non Af Amer 16 (L) >60 mL/min   GFR calc Af Amer 19 (L) >60 mL/min   Anion gap 13 5 - 15     Comment: Performed at [facility], [place] [address], [place], [place] [address]   CT ABDOMEN PELVIS WO CONTRAST  Result Date: 08/20/2019 CLINICAL DATA:  78 year old female with lower abdominal pain. EXAM: CT ABDOMEN AND PELVIS WITHOUT CONTRAST TECHNIQUE: Multidetector CT imaging of the abdomen and pelvis was performed following the standard protocol without IV contrast. COMPARISON:  CT of the abdomen pelvis dated 04/28/2016. FINDINGS: Evaluation of this exam is limited in the absence of intravenous contrast. Lower chest: The visualized lung bases are clear. Coronary vascular calcification noted. No intra-abdominal free air or free fluid. Hepatobiliary: Subcentimeter hypodense focus in the left lobe of the liver is not well characterized. The liver is otherwise unremarkable. Pneumobilia noted primarily in the left intrahepatic biliary trees, new since the prior CT. Cholecystectomy. Pancreas: Unremarkable. No pancreatic ductal dilatation or surrounding inflammatory changes. Spleen: Normal in size without focal abnormality. Adrenals/Urinary Tract: The adrenal glands are unremarkable. There is a 5.2 x 6.5 cm lobulated, probable multi septated cyst in the upper pole of the right kidney with thin linear calcification. A 2.5 cm inferior pole cyst is also noted. These can be better evaluated with ultrasound on a nonemergent basis. There is no hydronephrosis or nephrolithiasis on either side. Bilateral perinephric stranding, nonspecific. Correlation with urinalysis recommended to evaluate for pyelonephritis. The visualized ureters appear unremarkable. There is a small pocket of air within the urinary bladder which may be related to recent instrumentation or infection. Stomach/Bowel: There is a small hiatal hernia. 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 Mild haziness and stranding of the fat surrounding the second portion of the duodenum noted which may represent mild duodenitis or mild inflammatory  changes of the duodenal diverticula. There is no bowel obstruction. Appendectomy. Vascular/Lymphatic: Mild aortoiliac atherosclerotic disease. The IVC is unremarkable. No portal venous gas. Reproductive: Hysterectomy. Other: None Musculoskeletal: Degenerative changes of the spine with multilevel disc desiccation and vacuum phenomena. No acute osseous pathology. Grade 1 L4-L5 anterolisthesis. IMPRESSION: 1. 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 Mild haziness and stranding of the fat surrounding the second portion of the duodenum may represent mild duodenitis or mild inflammatory changes of the duodenal diverticula. No bowel obstruction. 2. Pneumobilia, new since the prior CT. Correlation with clinical exam and liver function tests recommended. 3. Bilateral renal cysts as described. These can be better evaluated with ultrasound on a nonemergent basis. 4. Bilateral perinephric stranding. Correlation with urinalysis recommended to exclude cystitis. 5. Aortic Atherosclerosis (ICD10-I70.0). Electronically Signed   By: Anner Crete M.D.   On: 08/20/2019 12:36    Pending Labs Unresulted Labs (From admission, onward)    Start     Ordered   08/21/19 0500  CBC  Tomorrow morning,   R     08/20/19 1051   08/21/19 0500  Comprehensive metabolic panel  Tomorrow morning,   R     08/20/19 1051   08/19/19 2146  SARS CORONAVIRUS 2 (TAT 6-24 HRS) Nasopharyngeal Nasopharyngeal Swab  (Tier 3 (TAT 6-24 hrs))  Once,  STAT    Question Answer Comment  Is this test for diagnosis or screening Screening   Symptomatic for COVID-19 as defined by CDC No   Hospitalized for COVID-19 No   Admitted to ICU for COVID-19 No   Previously tested for COVID-19 No   Resident in a congregate (group) care setting No   Employed in healthcare setting No   Pregnant No      08/19/19 2145   08/19/19 2137  Urine culture  Add-on,   AD     08/19/19 2136          Vitals/Pain Today's Vitals   08/20/19 1543 08/20/19 1844 08/20/19 1951 08/20/19  1957  BP: (!) 128/97 112/73  126/74  Pulse: 95 92  91  Resp: 18 18  18   Temp:    99.8 F (37.7 C)  TempSrc:    Oral  SpO2: 97% 97%  97%  Weight:      Height:      PainSc:   0-No pain     Isolation Precautions No active isolations  Medications Medications  sodium chloride flush (NS) 0.9 % injection 3 mL (3 mLs Intravenous Not Given 08/19/19 1908)  0.9 %  sodium chloride infusion ( Intravenous Stopped 08/20/19 1007)  ondansetron (ZOFRAN) tablet 4 mg ( Oral See Alternative 08/19/19 2207)    Or  ondansetron (ZOFRAN) injection 4 mg (4 mg Intravenous Given 08/19/19 2207)  cefTRIAXone (ROCEPHIN) 1 g in sodium chloride 0.9 % 100 mL IVPB (has no administration in time range)  atenolol (TENORMIN) tablet 50 mg (50 mg Oral Given 08/20/19 1112)  levothyroxine (SYNTHROID) tablet 62.5 mcg (62.5 mcg Oral Given 08/20/19 1113)  dicyclomine (BENTYL) capsule 10 mg (has no administration in time range)  pantoprazole (PROTONIX) EC tablet 40 mg (40 mg Oral Given 08/20/19 1112)  carbidopa-levodopa (SINEMET IR) 25-100 MG per tablet immediate release 1 tablet (1 tablet Oral Given 08/20/19 1549)  aspirin EC tablet 81 mg (81 mg Oral Given 08/20/19 1112)  0.9 %  sodium chloride infusion ( Intravenous New Bag/Given 08/20/19 1118)  enoxaparin (LOVENOX) injection 30 mg (30 mg Subcutaneous Given 08/20/19 1218)  sodium chloride 0.9 % bolus 1,000 mL (0 mLs Intravenous Stopped 08/19/19 2136)  potassium chloride SA (KLOR-CON) CR tablet 40 mEq (40 mEq Oral Given 08/19/19 2146)  cefTRIAXone (ROCEPHIN) 1 g in sodium chloride 0.9 % 100 mL IVPB (0 g Intravenous Stopped 08/19/19 2239)    Mobility non-ambulatory

## 2019-08-21 ENCOUNTER — Other Ambulatory Visit: Payer: Self-pay

## 2019-08-21 LAB — COMPREHENSIVE METABOLIC PANEL
ALT: <5 U/L (ref 0–44)
AST: 12 U/L — ABNORMAL LOW (ref 15–41)
Albumin: 2.2 g/dL — ABNORMAL LOW (ref 3.5–5.0)
Alkaline Phosphatase: 100 U/L (ref 38–126)
Anion gap: 12 (ref 5–15)
BUN: 54 mg/dL — ABNORMAL HIGH (ref 8–23)
CO2: 21 mmol/L — ABNORMAL LOW (ref 22–32)
Calcium: 8.1 mg/dL — ABNORMAL LOW (ref 8.9–10.3)
Chloride: 100 mmol/L (ref 98–111)
Creatinine, Ser: 3.18 mg/dL — ABNORMAL HIGH (ref 0.44–1.00)
GFR calc Af Amer: 15 mL/min — ABNORMAL LOW (ref >60)
GFR calc non Af Amer: 13 mL/min — ABNORMAL LOW (ref >60)
Glucose, Bld: 111 mg/dL — ABNORMAL HIGH (ref 70–99)
Potassium: 3.5 mmol/L (ref 3.5–5.1)
Sodium: 133 mmol/L — ABNORMAL LOW (ref 135–145)
Total Bilirubin: 0.8 mg/dL (ref 0.3–1.2)
Total Protein: 5.2 g/dL — ABNORMAL LOW (ref 6.5–8.1)

## 2019-08-21 LAB — CBC
HCT: 37.5 % (ref 36.0–46.0)
Hemoglobin: 12.3 g/dL (ref 12.0–15.0)
MCH: 30.2 pg (ref 26.0–34.0)
MCHC: 32.8 g/dL (ref 30.0–36.0)
MCV: 92.1 fL (ref 80.0–100.0)
Platelets: 174 10*3/uL (ref 150–400)
RBC: 4.07 MIL/uL (ref 3.87–5.11)
RDW: 15.3 % (ref 11.5–15.5)
WBC: 14.7 10*3/uL — ABNORMAL HIGH (ref 4.0–10.5)
nRBC: 0 % (ref 0.0–0.2)

## 2019-08-21 LAB — SODIUM, URINE, RANDOM: Sodium, Ur: <10 mmol/L

## 2019-08-21 LAB — CREATININE, URINE, RANDOM: Creatinine, Urine: 94.69 mg/dL

## 2019-08-21 LAB — SARS CORONAVIRUS 2 (TAT 6-24 HRS): SARS Coronavirus 2: NEGATIVE

## 2019-08-21 MED ORDER — CHLORHEXIDINE GLUCONATE CLOTH 2 % EX PADS
6.0000 | MEDICATED_PAD | Freq: Every day | CUTANEOUS | Status: DC
  Administered 2019-08-21 – 2019-08-22 (×2): 6 via TOPICAL

## 2019-08-21 MED ORDER — METRONIDAZOLE IN NACL 5-0.79 MG/ML-% IV SOLN
500.0000 mg | Freq: Three times a day (TID) | INTRAVENOUS | Status: DC
  Administered 2019-08-21 – 2019-08-23 (×5): 500 mg via INTRAVENOUS
  Filled 2019-08-21 (×5): qty 100

## 2019-08-21 MED ORDER — SODIUM CHLORIDE 0.9 % IV SOLN: INTRAVENOUS | Status: DC

## 2019-08-21 NOTE — Progress Notes (Signed)
Patient admitted with acute urinary retention and AKI. She has had slight urinary output during PM. Receiving IV fluids. Order received for urinary catheter. Will monitor urinary output closely. Eulas Post, RN

## 2019-08-21 NOTE — Progress Notes (Signed)
PROGRESS NOTE    Jacqueline Gamble  ZYS:[id number] DOB: 15-Apr-1941 DOA: 08/19/2019 PCP: Galen Manila, MD   Brief Narrative:  Patient is a 78 year old female with history of Parkinson's disease, diet-controlled diabetes, history of colon cancer, got open to the emergency department with complaints of difficulty urination, lower abdominal pain/discomfort for 2 to 3 days.  Patient is a very poor historian.  On presentation, she was found to have a 62 mL of urine in the bladder.  Found to have acute kidney injury, leukocytosis, hypokalemia.  UA showed abnormal results with significant bacteriuria, leukocytes and large leukocyte esterase.  CT abdomen/pelvis done in the emergency department also showed possible duodenitis, cystitis.  Started on broad-spectrum antibiotics.  Assessment & Plan:   Principal Problem:   Acute lower UTI Active Problems:   Hypertension   Acute urinary retention   AKI (acute kidney injury) ([place])   UTI (urinary tract infection)   UTI: Continue ceftriaxone.  Urine culture showing E. coli.  Blood culture will be sent.  CT abdomen/pelvis showed bilateral perinephric stranding, cystitis.  Complains of lower abdominal pain.  Possible duodenitis: Complains of pain on the mid/lower abdomen.  Currently on clear liquid diet.  Denies any nausea or vomiting.  Will advance the diet to full liquid.  She does not have any bowel movement today.  We will get GI pathogen panel if possible.  I also added Flagyl.  AKI: Creatinine was normal in 2017.  Worsening kidney function.  Creatinine in the range of 3 today.  Urine sodium less than 10.  This is most likely prerenal.  I will continue IV fluids.  We have inserted Foley catheter.  CT abdomen/pelvis did not show any hydronephrosis.  She was also on torsemide, lisinopril at home.  Monitor urine output.  Hypertension: Currently blood pressure stable.  On atenolol, ACE/ARB, Demadex at home.  Pending echocardiogram.  History of colon  cancer:  Currently in remission.  Status post sigmoid colectomy.  Suspected cognitive  dysfunction: Very poor historian.  Lives with husband.  Able to ambulate at home.          DVT prophylaxis: Lovenox Code Status: Full code Family Communication: None present at the bedside Disposition Plan: Home when clinically stable   Consultants: None  Procedures: None  Antimicrobials:  Anti-infectives (From admission, onward)   Start     Dose/Rate Route Frequency Ordered Stop   08/21/19 1500  metroNIDAZOLE (FLAGYL) IVPB 500 mg     500 mg 100 mL/hr over 60 Minutes Intravenous Every 8 hours 08/21/19 1456     08/20/19 2200  cefTRIAXone (ROCEPHIN) 1 g in sodium chloride 0.9 % 100 mL IVPB     1 g 200 mL/hr over 30 Minutes Intravenous Every 24 hours 08/19/19 2158     08/19/19 2145  cefTRIAXone (ROCEPHIN) 1 g in sodium chloride 0.9 % 100 mL IVPB     1 g 200 mL/hr over 30 Minutes Intravenous  Once 08/19/19 2142 08/19/19 2239      Subjective:  Patient seen and examined the bedside this afternoon.  Hemodynamically stable.  She is very poor historian.  She looks comfortable during my evaluation.  On room air.  Complains of some mid to lower abdominal discomfort.  No nausea or vomiting.  Objective: Vitals:   08/20/19 1844 08/20/19 1957 08/21/19 0418 08/21/19 1349  BP: 112/73 126/74 133/78 (!) 147/78  Pulse: 92 91 88 83  Resp: 18 18 18 20   Temp:  99.8 F (37.7 C) 98.1 F (  36.7 C) 98.4 F (36.9 C)  TempSrc:  Oral  Oral  SpO2: 97% 97% 94% 94%  Weight:      Height:        Intake/Output Summary (Last 24 hours) at 08/21/2019 1459 Last data filed at 08/21/2019 1030 Gross per 24 hour  Intake 1609.04 ml  Output 100 ml  Net 1509.04 ml   Filed Weights   08/20/19 1100  Weight: 88.5 kg    Examination:  General exam: Appears calm and comfortable ,Not in distress, obese  HEENT:PERRL,Oral mucosa moist, Ear/Nose normal on gross exam Respiratory system: Bilateral equal air entry, normal  vesicular breath sounds, no wheezes or crackles  Cardiovascular system: S1 & S2 heard, RRR. No JVD, murmurs, rubs, gallops or clicks. No pedal edema. Gastrointestinal system: Abdomen is nondistended, soft and has mild mid to lower region  tenderness. No organomegaly or masses felt. Normal bowel sounds heard. Central nervous system: Alert and oriented. No focal neurological deficits. Extremities: No edema, no clubbing ,no cyanosis, distal peripheral pulses palpable. Skin: No rashes, lesions or ulcers,no icterus ,no pallor      Data Reviewed: I have personally reviewed following labs and imaging studies  CBC: Recent Labs  Lab 08/19/19 1940 08/20/19 0515 08/21/19 0241  WBC 17.7* 16.5* 14.7*  HGB 14.1 13.1 12.3  HCT 43.2 40.9 37.5  MCV 91.7 93.6 92.1  PLT 200 187 814   Basic Metabolic Panel: Recent Labs  Lab 08/19/19 1940 08/20/19 0515 08/21/19 0241  NA 135 136 133*  K 2.9* 3.9 3.5  CL 96* 100 100  CO2 25 23 21*  GLUCOSE 194* 161* 111*  BUN 33* 40* 54*  CREATININE 2.41* 2.69* 3.18*  CALCIUM 8.7* 8.2* 8.1*   GFR: Estimated Creatinine Clearance: 14.2 mL/min (A) (by C-G formula based on SCr of 3.18 mg/dL (H)). Liver Function Tests: Recent Labs  Lab 08/19/19 1940 08/21/19 0241  AST 20 12*  ALT 17 <5  ALKPHOS 94 100  BILITOT 1.3* 0.8  PROT 6.0* 5.2*  ALBUMIN 2.8* 2.2*   Recent Labs  Lab 08/19/19 1940  LIPASE 14   No results for input(s): AMMONIA in the last 168 hours. Coagulation Profile: No results for input(s): INR, PROTIME in the last 168 hours. Cardiac Enzymes: No results for input(s): CKTOTAL, CKMB, CKMBINDEX, TROPONINI in the last 168 hours. BNP (last 3 results) No results for input(s): PROBNP in the last 8760 hours. HbA1C: No results for input(s): HGBA1C in the last 72 hours. CBG: No results for input(s): GLUCAP in the last 168 hours. Lipid Profile: No results for input(s): CHOL, HDL, LDLCALC, TRIG, CHOLHDL, LDLDIRECT in the last 72 hours. Thyroid  Function Tests: No results for input(s): TSH, T4TOTAL, FREET4, T3FREE, THYROIDAB in the last 72 hours. Anemia Panel: No results for input(s): VITAMINB12, FOLATE, FERRITIN, TIBC, IRON, RETICCTPCT in the last 72 hours. Sepsis Labs: No results for input(s): PROCALCITON, LATICACIDVEN in the last 168 hours.  Recent Results (from the past 240 hour(s))  Urine culture     Status: Abnormal (Preliminary result)   Collection Time: 08/19/19  9:37 PM   Specimen: Urine, Catheterized  Result Value Ref Range Status   Specimen Description   Final    URINE, CATHETERIZED Performed at [facility] [address], [place], [place] [address]    Special Requests   Final    NONE Performed at [facility], [place] [address], [place], [place] [address]    Culture >=100,000 COLONIES/mL ESCHERICHIA COLI (A)  Final  Report Status PENDING  Incomplete         Radiology Studies: CT ABDOMEN PELVIS WO CONTRAST  Result Date: 08/20/2019 CLINICAL DATA:  78 year old female with lower abdominal pain. EXAM: CT ABDOMEN AND PELVIS WITHOUT CONTRAST TECHNIQUE: Multidetector CT imaging of the abdomen and pelvis was performed following the standard protocol without IV contrast. COMPARISON:  CT of the abdomen pelvis dated 04/28/2016. FINDINGS: Evaluation of this exam is limited in the absence of intravenous contrast. Lower chest: The visualized lung bases are clear. Coronary vascular calcification noted. No intra-abdominal free air or free fluid. Hepatobiliary: Subcentimeter hypodense focus in the left lobe of the liver is not well characterized. The liver is otherwise unremarkable. Pneumobilia noted primarily in the left intrahepatic biliary trees, new since the prior CT. Cholecystectomy. Pancreas: Unremarkable. No pancreatic ductal dilatation or surrounding inflammatory changes. Spleen: Normal in size without focal abnormality. Adrenals/Urinary Tract: The adrenal glands are unremarkable.  There is a 5.2 x 6.5 cm lobulated, probable multi septated cyst in the upper pole of the right kidney with thin linear calcification. A 2.5 cm inferior pole cyst is also noted. These can be better evaluated with ultrasound on a nonemergent basis. There is no hydronephrosis or nephrolithiasis on either side. Bilateral perinephric stranding, nonspecific. Correlation with urinalysis recommended to evaluate for pyelonephritis. The visualized ureters appear unremarkable. There is a small pocket of air within the urinary bladder which may be related to recent instrumentation or infection. Stomach/Bowel: There is a small hiatal hernia. 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 Mild haziness and stranding of the fat surrounding the second portion of the duodenum noted which may represent mild duodenitis or mild inflammatory changes of the duodenal diverticula. There is no bowel obstruction. Appendectomy. Vascular/Lymphatic: Mild aortoiliac atherosclerotic disease. The IVC is unremarkable. No portal venous gas. Reproductive: Hysterectomy. Other: None Musculoskeletal: Degenerative changes of the spine with multilevel disc desiccation and vacuum phenomena. No acute osseous pathology. Grade 1 L4-L5 anterolisthesis. IMPRESSION: 1. 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 Mild haziness and stranding of the fat surrounding the second portion of the duodenum may represent mild duodenitis or mild inflammatory changes of the duodenal diverticula. No bowel obstruction. 2. Pneumobilia, new since the prior CT. Correlation with clinical exam and liver function tests recommended. 3. Bilateral renal cysts as described. These can be better evaluated with ultrasound on a nonemergent basis. 4. Bilateral perinephric stranding. Correlation with urinalysis recommended to exclude cystitis. 5. Aortic Atherosclerosis (ICD10-I70.0). Electronically Signed   By: Anner Crete M.D.   On: 08/20/2019 12:36        Scheduled Meds: . aspirin EC  81 mg Oral  Daily  . atenolol  50 mg Oral Daily  . carbidopa-levodopa  1 tablet Oral TID  . Chlorhexidine Gluconate Cloth  6 each Topical Daily  . enoxaparin (LOVENOX) injection  30 mg Subcutaneous Q24H  . levothyroxine  62.5 mcg Oral Q0600  . pantoprazole  40 mg Oral BID  . sodium chloride flush  3 mL Intravenous Once   Continuous Infusions: . sodium chloride 125 mL/hr at 08/21/19 1432  . cefTRIAXone (ROCEPHIN)  IV 1 g (08/20/19 2141)  . metronidazole       LOS: 1 day    Time spent: 35 mins.More than 50% of that time was spent in counseling and/or coordination of care.      Shelly Coss, MD [facility] Pager [phone]  If 7PM-7AM, please contact night-coverage [email or web address] Password [email or web address] 08/21/2019, 2:59 PM

## 2019-08-22 ENCOUNTER — Inpatient Hospital Stay (HOSPITAL_COMMUNITY): Payer: Medicare Other

## 2019-08-22 DIAGNOSIS — I5033 Acute on chronic diastolic (congestive) heart failure: Secondary | ICD-10-CM

## 2019-08-22 LAB — BASIC METABOLIC PANEL
Anion gap: 11 (ref 5–15)
BUN: 53 mg/dL — ABNORMAL HIGH (ref 8–23)
CO2: 21 mmol/L — ABNORMAL LOW (ref 22–32)
Calcium: 8.1 mg/dL — ABNORMAL LOW (ref 8.9–10.3)
Chloride: 102 mmol/L (ref 98–111)
Creatinine, Ser: 2.75 mg/dL — ABNORMAL HIGH (ref 0.44–1.00)
GFR calc Af Amer: 18 mL/min — ABNORMAL LOW (ref >60)
GFR calc non Af Amer: 16 mL/min — ABNORMAL LOW (ref >60)
Glucose, Bld: 126 mg/dL — ABNORMAL HIGH (ref 70–99)
Potassium: 3.4 mmol/L — ABNORMAL LOW (ref 3.5–5.1)
Sodium: 134 mmol/L — ABNORMAL LOW (ref 135–145)

## 2019-08-22 LAB — CBC WITH DIFFERENTIAL/PLATELET
Abs Immature Granulocytes: 0.05 10*3/uL (ref 0.00–0.07)
Basophils Absolute: 0 10*3/uL (ref 0.0–0.1)
Basophils Relative: 0 %
Eosinophils Absolute: 0.4 10*3/uL (ref 0.0–0.5)
Eosinophils Relative: 3 %
HCT: 36.6 % (ref 36.0–46.0)
Hemoglobin: 11.8 g/dL — ABNORMAL LOW (ref 12.0–15.0)
Immature Granulocytes: 1 %
Lymphocytes Relative: 6 %
Lymphs Abs: 0.6 10*3/uL — ABNORMAL LOW (ref 0.7–4.0)
MCH: 29.4 pg (ref 26.0–34.0)
MCHC: 32.2 g/dL (ref 30.0–36.0)
MCV: 91.3 fL (ref 80.0–100.0)
Monocytes Absolute: 0.9 10*3/uL (ref 0.1–1.0)
Monocytes Relative: 8 %
Neutro Abs: 9.2 10*3/uL — ABNORMAL HIGH (ref 1.7–7.7)
Neutrophils Relative %: 82 %
Platelets: 194 10*3/uL (ref 150–400)
RBC: 4.01 MIL/uL (ref 3.87–5.11)
RDW: 15.2 % (ref 11.5–15.5)
WBC: 11.1 10*3/uL — ABNORMAL HIGH (ref 4.0–10.5)
nRBC: 0 % (ref 0.0–0.2)

## 2019-08-22 LAB — ECHOCARDIOGRAM COMPLETE
Height: 60 in
Weight: 3120 oz

## 2019-08-22 LAB — URINE CULTURE: Culture: >=100000 — AB

## 2019-08-22 MED ORDER — POTASSIUM CHLORIDE CRYS ER 20 MEQ PO TBCR
40.0000 meq | EXTENDED_RELEASE_TABLET | Freq: Once | ORAL | Status: AC
  Administered 2019-08-22: 40 meq via ORAL
  Filled 2019-08-22: qty 2

## 2019-08-22 NOTE — Care Management Important Message (Signed)
Important Message  Patient Details IM Letter given to Velva Harman RN to present to the Patient Name: Jacqueline Gamble MRN: [id number] Date of Birth: 03-01-1941   Medicare Important Message Given:  Yes     Kerin Salen 08/22/2019, 12:23 PM

## 2019-08-22 NOTE — Evaluation (Signed)
Physical Therapy Evaluation Patient Details Name: Jacqueline Gamble MRN: [id number] DOB: 1941-05-08 Today's Date: 08/22/2019   History of Present Illness  78 year old female with history of Parkinson's disease, diet-controlled diabetes, history of colon cancer. admitted through  emergency department with complaints of difficulty urination, lower abdominal pain/discomfort  Clinical Impression  Pt admitted with above diagnosis.  Pt motivated to amb/work with PT will follow in acute setting. She just finished a stent of PT after her knee surgery, do not feel she will need f/u PT post acute  Pt currently with functional limitations due to the deficits listed below (see PT Problem List). Pt will benefit from skilled PT to increase their independence and safety with mobility to allow discharge to the venue listed below.       Follow Up Recommendations Supervision - Intermittent;No PT follow up    Equipment Recommendations  None recommended by PT    Recommendations for Other Services       Precautions / Restrictions Precautions Precautions: Fall      Mobility  Bed Mobility               General bed mobility comments: NT in chair on arrival  Transfers Overall transfer level: Needs assistance Equipment used: Rolling walker (2 wheeled) Transfers: Sit to/from Stand Sit to Stand: Min guard         General transfer comment: cues for hand placement and safety  Ambulation/Gait Ambulation/Gait assistance: Min guard;Min assist Gait Distance (Feet): 300 Feet Assistive device: Rolling walker (2 wheeled) Gait Pattern/deviations: Step-through pattern;Decreased stride length;Drifts right/left Gait velocity: decr   General Gait Details: pt wanted to amb without RW however attempting to furniture walk so provided with RW. unsteady however no overt LOB, drifting and intermitent assist needed to negotiate obstacles in hallway  Stairs            Wheelchair Mobility    Modified  Rankin (Stroke Patients Only)       Balance Overall balance assessment: Needs assistance   Sitting balance-Leahy Scale: Good       Standing balance-Leahy Scale: Fair                               Pertinent Vitals/Pain Pain Assessment: No/denies pain    Home Living Family/patient expects to be discharged to:: Private residence Living Arrangements: Spouse/significant other Available Help at Discharge: Family Type of Home: House Home Access: Stairs to enter     Home Layout: One level;Laundry or work area in [place]: Environmental consultant - 2 wheels;Cane - single point;Wheelchair - manual      Prior Function Level of Independence: Independent               Hand Dominance        Extremity/Trunk Assessment   Upper Extremity Assessment Upper Extremity Assessment: Overall WFL for tasks assessed    Lower Extremity Assessment Lower Extremity Assessment: Generalized weakness       Communication   Communication: No difficulties  Cognition Arousal/Alertness: Awake/alert Behavior During Therapy: WFL for tasks assessed/performed Overall Cognitive Status: Within Functional Limits for tasks assessed                                        General Comments      Exercises     Assessment/Plan    PT Assessment Patient  needs continued PT services  PT Problem List Decreased activity tolerance;Decreased balance;Decreased knowledge of use of DME;Decreased mobility       PT Treatment Interventions DME instruction;Therapeutic exercise;Gait training;Functional mobility training;Therapeutic activities;Patient/family education;Balance training    PT Goals (Current goals can be found in the Care Plan section)  Acute Rehab PT Goals PT Goal Formulation: With patient Time For Goal Achievement: 09/05/19    Frequency Min 3X/week   Barriers to discharge        Co-evaluation               AM-PAC PT "6 Clicks" Mobility  Outcome  Measure Help needed turning from your back to your side while in a flat bed without using bedrails?: A Little Help needed moving from lying on your back to sitting on the side of a flat bed without using bedrails?: A Little Help needed moving to and from a bed to a chair (including a wheelchair)?: None Help needed standing up from a chair using your arms (e.g., wheelchair or bedside chair)?: A Little Help needed to walk in hospital room?: A Little Help needed climbing 3-5 steps with a railing? : A Little 6 Click Score: 19    End of Session   Activity Tolerance: Patient tolerated treatment well Patient left: in chair;with call bell/phone within reach;with chair alarm set   PT Visit Diagnosis: Difficulty in walking, not elsewhere classified (R26.2)    Time: 7616-0737 PT Time Calculation (min) (ACUTE ONLY): 22 min   Charges:   PT Evaluation $PT Eval Low Complexity: 1 Low          Lynesha Bango, PT   Acute Rehab Dept [facility]): [phone]   08/22/2019   [facility] 08/22/2019, 2:24 PM

## 2019-08-22 NOTE — Progress Notes (Signed)
Physical Therapy Treatment Patient Details Name: Jacqueline Gamble MRN: [id number] DOB: 11/06/1940 Today's Date: 08/22/2019    History of Present Illness 78 year old female with history of Parkinson's disease, diet-controlled diabetes, history of colon cancer. admitted through  emergency department with complaints of difficulty urination, lower abdominal pain/discomfort    PT Comments    Pt motivated to work with PT. incr amb distance/tolerance to activity this pm. continue PT POC   Follow Up Recommendations  Supervision - Intermittent;No PT follow up     Equipment Recommendations  None recommended by PT    Recommendations for Other Services       Precautions / Restrictions Precautions Precautions: Fall Restrictions Weight Bearing Restrictions: No    Mobility  Bed Mobility Overal bed mobility: Modified Independent             General bed mobility comments: NT in chair on arrival  Transfers Overall transfer level: Needs assistance Equipment used: Rolling walker (2 wheeled) Transfers: Sit to/from Stand Sit to Stand: Min guard         General transfer comment: cues for hand placement and safety  Ambulation/Gait Ambulation/Gait assistance: Min guard;Supervision Gait Distance (Feet): 420 Feet Assistive device: Rolling walker (2 wheeled) Gait Pattern/deviations: Step-through pattern;Decreased stride length;Drifts right/left Gait velocity: decr   General Gait Details: pt with slow but steady gait,  no overt LOB, intermittent assist with  RW direction    Stairs             Wheelchair Mobility    Modified Rankin (Stroke Patients Only)       Balance Overall balance assessment: Needs assistance   Sitting balance-Leahy Scale: Good       Standing balance-Leahy Scale: Fair                              Cognition Arousal/Alertness: Awake/alert Behavior During Therapy: WFL for tasks assessed/performed Overall Cognitive Status: Within  Functional Limits for tasks assessed                                        Exercises General Exercises - Lower Extremity Ankle Circles/Pumps: AROM;Both;10 reps    General Comments        Pertinent Vitals/Pain Pain Assessment: No/denies pain    Home Living                      Prior Function            PT Goals (current goals can now be found in the care plan section) Acute Rehab PT Goals PT Goal Formulation: With patient Time For Goal Achievement: 09/05/19 Progress towards PT goals: Progressing toward goals    Frequency    Min 3X/week      PT Plan Current plan remains appropriate    Co-evaluation              AM-PAC PT "6 Clicks" Mobility   Outcome Measure  Help needed turning from your back to your side while in a flat bed without using bedrails?: A Little Help needed moving from lying on your back to sitting on the side of a flat bed without using bedrails?: A Little Help needed moving to and from a bed to a chair (including a wheelchair)?: None Help needed standing up from a chair using your arms (e.g., wheelchair or bedside  chair)?: A Little Help needed to walk in hospital room?: A Little Help needed climbing 3-5 steps with a railing? : A Little 6 Click Score: 19    End of Session   Activity Tolerance: Patient tolerated treatment well Patient left: with call bell/phone within reach;in bed;with bed alarm set   PT Visit Diagnosis: Difficulty in walking, not elsewhere classified (R26.2)     Time: 6269-4854 PT Time Calculation (min) (ACUTE ONLY): 28 min  Charges:  $Gait Training: 23-37 mins                     Maaliyah Adolph, PT   Acute Rehab Dept [facility]): [phone]   08/22/2019    [facility] 08/22/2019, 4:48 PM

## 2019-08-22 NOTE — Progress Notes (Signed)
PROGRESS NOTE    Jacqueline Gamble  UXN:[id number] DOB: September 16, 1940 DOA: 08/19/2019 PCP: Galen Manila, MD   Brief Narrative:  Patient is a 78 year old female with history of Parkinson's disease, diet-controlled diabetes, history of colon cancer, got open to the emergency department with complaints of difficulty urination, lower abdominal pain/discomfort for 2 to 3 days.  Patient is a very poor historian.  On presentation, she was found to have a 62 mL of urine in the bladder.  Found to have acute kidney injury, leukocytosis, hypokalemia.  UA showed abnormal results with significant bacteriuria, leukocytes and large leukocyte esterase.  CT abdomen/pelvis done in the emergency department also showed possible duodenitis, cystitis.  Started on broad-spectrum antibiotics.  Started on IV fluids for AKI.  Foley catheter placed.  Assessment & Plan:   Principal Problem:   Acute lower UTI Active Problems:   Hypertension   Acute urinary retention   AKI (acute kidney injury) ([place])   UTI (urinary tract infection)   UTI: Continue ceftriaxone.  Urine culture showing E. coli.  Blood culture will be followed  CT abdomen/pelvis showed bilateral perinephric stranding, cystitis.  Improvement in  abdominal pain.  Possible duodenitis: Complained of pain on the mid/lower abdomen.    Denies any nausea or vomiting.  Diet advanced to soft.  She does not have any bowel movement today.  We will get GI pathogen panel if possible.  Continue current antibiotics.  CKD stage unknown/possible AKI: Creatinine was normal in 2017.  As per the sister, she has history of CKD but further detail is not available.  Urine sodium less than 10.  This is most likely prerenal.  I will continue IV fluids.  We have inserted Foley catheter.  CT abdomen/pelvis did not show any hydronephrosis.  She was also on torsemide, lisinopril at home.  Monitor urine output.  Hypertension: Currently blood pressure stable.  On atenolol, ACE/ARB,  Demadex at home.  Pending echocardiogram.  History of colon cancer:  Currently in remission.  Status post sigmoid colectomy.  Suspected cognitive  Dysfunction/parkinsons disease: Very poor historian.  Lives with husband.  Able to ambulate at home.PT assessment requested.  Hypokalemia: Supplemented with potassium.          DVT prophylaxis: Lovenox Code Status: Full code Family Communication: None present at the bedside Disposition Plan: Home when clinically stable   Consultants: None  Procedures: None  Antimicrobials:  Anti-infectives (From admission, onward)   Start     Dose/Rate Route Frequency Ordered Stop   08/21/19 1515  metroNIDAZOLE (FLAGYL) IVPB 500 mg     500 mg 100 mL/hr over 60 Minutes Intravenous Every 8 hours 08/21/19 1456     08/20/19 2200  cefTRIAXone (ROCEPHIN) 1 g in sodium chloride 0.9 % 100 mL IVPB     1 g 200 mL/hr over 30 Minutes Intravenous Every 24 hours 08/19/19 2158     08/19/19 2145  cefTRIAXone (ROCEPHIN) 1 g in sodium chloride 0.9 % 100 mL IVPB     1 g 200 mL/hr over 30 Minutes Intravenous  Once 08/19/19 2142 08/19/19 2239      Subjective:  Patient seen and examined the bedside this morning.  Hemodynamically stable.  Looked comfortable.  Denies any abdomen pain today.  No diarrhea, nausea or vomiting.  Wants to eat some solid food.  Making good urine.  Objective: Vitals:   08/21/19 1938 08/22/19 0206 08/22/19 0414 08/22/19 1217  BP: (!) 142/82 (!) 159/85 140/90 (!) 159/92  Pulse: 88 81 80 75  Resp: 18 18 20 18   Temp: 99.5 F (37.5 C) 97.8 F (36.6 C) 98.2 F (36.8 C) 98.1 F (36.7 C)  TempSrc: Oral Oral Oral Oral  SpO2: 95% 100% 97% 98%  Weight:      Height:        Intake/Output Summary (Last 24 hours) at 08/22/2019 1355 Last data filed at 08/22/2019 2585 Gross per 24 hour  Intake 2161.65 ml  Output 1210 ml  Net 951.65 ml   Filed Weights   08/20/19 1100  Weight: 88.5 kg    Examination:  General exam: Appears calm and  comfortable ,Not in distress, obese  HEENT:PERRL,Oral mucosa moist, Ear/Nose normal on gross exam Respiratory system: Bilateral equal air entry, normal vesicular breath sounds, no wheezes or crackles  Cardiovascular system: S1 & S2 heard, RRR. No JVD, murmurs, rubs, gallops or clicks. Trace pedal edema. Gastrointestinal system: Abdomen is nondistended, soft and nontender. No organomegaly or masses felt. Normal bowel sounds heard. Central nervous system: Alert and oriented. No focal neurological deficits. Extremities: Trace lower extremity edema, no clubbing ,no cyanosis, distal peripheral pulses palpable. Skin: No rashes, lesions or ulcers,no icterus ,no pallor      Data Reviewed: I have personally reviewed following labs and imaging studies  CBC: Recent Labs  Lab 08/19/19 1940 08/20/19 0515 08/21/19 0241 08/22/19 0300  WBC 17.7* 16.5* 14.7* 11.1*  NEUTROABS  --   --   --  9.2*  HGB 14.1 13.1 12.3 11.8*  HCT 43.2 40.9 37.5 36.6  MCV 91.7 93.6 92.1 91.3  PLT 200 187 174 277   Basic Metabolic Panel: Recent Labs  Lab 08/19/19 1940 08/20/19 0515 08/21/19 0241 08/22/19 0300  NA 135 136 133* 134*  K 2.9* 3.9 3.5 3.4*  CL 96* 100 100 102  CO2 25 23 21* 21*  GLUCOSE 194* 161* 111* 126*  BUN 33* 40* 54* 53*  CREATININE 2.41* 2.69* 3.18* 2.75*  CALCIUM 8.7* 8.2* 8.1* 8.1*   GFR: Estimated Creatinine Clearance: 16.4 mL/min (A) (by C-G formula based on SCr of 2.75 mg/dL (H)). Liver Function Tests: Recent Labs  Lab 08/19/19 1940 08/21/19 0241  AST 20 12*  ALT 17 <5  ALKPHOS 94 100  BILITOT 1.3* 0.8  PROT 6.0* 5.2*  ALBUMIN 2.8* 2.2*   Recent Labs  Lab 08/19/19 1940  LIPASE 14   No results for input(s): AMMONIA in the last 168 hours. Coagulation Profile: No results for input(s): INR, PROTIME in the last 168 hours. Cardiac Enzymes: No results for input(s): CKTOTAL, CKMB, CKMBINDEX, TROPONINI in the last 168 hours. BNP (last 3 results) No results for input(s):  PROBNP in the last 8760 hours. HbA1C: No results for input(s): HGBA1C in the last 72 hours. CBG: No results for input(s): GLUCAP in the last 168 hours. Lipid Profile: No results for input(s): CHOL, HDL, LDLCALC, TRIG, CHOLHDL, LDLDIRECT in the last 72 hours. Thyroid Function Tests: No results for input(s): TSH, T4TOTAL, FREET4, T3FREE, THYROIDAB in the last 72 hours. Anemia Panel: No results for input(s): VITAMINB12, FOLATE, FERRITIN, TIBC, IRON, RETICCTPCT in the last 72 hours. Sepsis Labs: No results for input(s): PROCALCITON, LATICACIDVEN in the last 168 hours.  Recent Results (from the past 240 hour(s))  Urine culture     Status: Abnormal   Collection Time: 08/19/19  9:37 PM   Specimen: Urine, Catheterized  Result Value Ref Range Status   Specimen Description   Final    URINE, CATHETERIZED Performed at [facility] [address] [place],  [place] [address]    Special Requests   Final    NONE Performed at [facility], [place] [address], [place], [place] [address]    Culture >=100,000 COLONIES/mL ESCHERICHIA COLI (A)  Final   Report Status 08/22/2019 FINAL  Final   Organism ID, Bacteria ESCHERICHIA COLI (A)  Final      Susceptibility   Escherichia coli - MIC*    AMPICILLIN >=32 RESISTANT Resistant     CEFAZOLIN <=4 SENSITIVE Sensitive     CEFTRIAXONE <=0.25 SENSITIVE Sensitive     CIPROFLOXACIN <=0.25 SENSITIVE Sensitive     GENTAMICIN <=1 SENSITIVE Sensitive     IMIPENEM <=0.25 SENSITIVE Sensitive     NITROFURANTOIN <=16 SENSITIVE Sensitive     TRIMETH/SULFA <=20 SENSITIVE Sensitive     AMPICILLIN/SULBACTAM >=32 RESISTANT Resistant     PIP/TAZO <=4 SENSITIVE Sensitive     * >=100,000 COLONIES/mL ESCHERICHIA COLI  SARS CORONAVIRUS 2 (TAT 6-24 HRS) Nasopharyngeal Nasopharyngeal Swab     Status: None   Collection Time: 08/21/19  2:36 PM   Specimen: Nasopharyngeal Swab  Result Value Ref Range Status   SARS Coronavirus 2 NEGATIVE  NEGATIVE Final    Comment: (NOTE) SARS-CoV-2 target nucleic acids are NOT DETECTED. The SARS-CoV-2 RNA is generally detectable in upper and lower respiratory specimens during the acute phase of infection. Negative results do not preclude SARS-CoV-2 infection, do not rule out co-infections with other pathogens, and should not be used as the sole basis for treatment or other patient management decisions. Negative results must be combined with clinical observations, patient history, and epidemiological information. The expected result is Negative. Fact Sheet for Patients: SugarRoll.be Fact Sheet for Healthcare Providers: https://www.woods-mathews.com/ This test is not yet approved or cleared by the [place] FDA and  has been authorized for detection and/or diagnosis of SARS-CoV-2 by FDA under an Emergency Use Authorization (EUA). This EUA will remain  in effect (meaning this test can be used) for the duration of the COVID-19 declaration under Section 56 4(b)(1) of the Act, 21 U.S.C. section 360bbb-3(b)(1), unless the authorization is terminated or revoked sooner. Performed at [facility], [place] [address], [place], [place] [address]   Culture, blood (routine x 2)     Status: None (Preliminary result)   Collection Time: 08/21/19  3:28 PM   Specimen: BLOOD LEFT HAND  Result Value Ref Range Status   Specimen Description   Final    BLOOD LEFT HAND Performed at [facility] [address] [place], [place] [address]    Special Requests   Final    BOTTLES DRAWN AEROBIC AND ANAEROBIC Blood Culture adequate volume Performed at [facility] [address] [place], [place] [address]    Culture   Final    NO GROWTH < 24 HOURS Performed at [facility] [address], [place], [place] [address]    Report Status PENDING  Incomplete  Culture, blood (routine x 2)     Status: None (Preliminary  result)   Collection Time: 08/21/19  3:28 PM   Specimen: BLOOD  Result Value Ref Range Status   Specimen Description   Final    BLOOD RIGHT ANTECUBITAL Performed at [facility] [address], [place], [place] [address]    Special Requests   Final    BOTTLES DRAWN AEROBIC AND ANAEROBIC Blood Culture adequate volume Performed at [facility] [address] [place], [place] [address]    Culture  Final    NO GROWTH < 24 HOURS Performed at [facility], [place] [address] [place], [place] [address]    Report Status PENDING  Incomplete         Radiology Studies: No results found.      Scheduled Meds: . aspirin EC  81 mg Oral Daily  . atenolol  50 mg Oral Daily  . carbidopa-levodopa  1 tablet Oral TID  . Chlorhexidine Gluconate Cloth  6 each Topical Daily  . enoxaparin (LOVENOX) injection  30 mg Subcutaneous Q24H  . levothyroxine  62.5 mcg Oral Q0600  . pantoprazole  40 mg Oral BID  . potassium chloride  40 mEq Oral Once  . sodium chloride flush  3 mL Intravenous Once   Continuous Infusions: . sodium chloride 75 mL/hr at 08/22/19 1051  . cefTRIAXone (ROCEPHIN)  IV 1 g (08/22/19 0036)  . metronidazole 500 mg (08/22/19 1116)     LOS: 2 days    Time spent: 35 mins.More than 50% of that time was spent in counseling and/or coordination of care.      Shelly Coss, MD [facility] Pager [phone]  If 7PM-7AM, please contact night-coverage [email or web address] Password [email or web address] 08/22/2019, 1:55 PM

## 2019-08-22 NOTE — Progress Notes (Signed)
  Echocardiogram 2D Echocardiogram has been performed.  Jacqueline Gamble 08/22/2019, 3:57 PM

## 2019-08-23 LAB — BASIC METABOLIC PANEL
Anion gap: 8 (ref 5–15)
BUN: 46 mg/dL — ABNORMAL HIGH (ref 8–23)
CO2: 23 mmol/L (ref 22–32)
Calcium: 8.3 mg/dL — ABNORMAL LOW (ref 8.9–10.3)
Chloride: 109 mmol/L (ref 98–111)
Creatinine, Ser: 2.39 mg/dL — ABNORMAL HIGH (ref 0.44–1.00)
GFR calc Af Amer: 22 mL/min — ABNORMAL LOW (ref >60)
GFR calc non Af Amer: 19 mL/min — ABNORMAL LOW (ref >60)
Glucose, Bld: 121 mg/dL — ABNORMAL HIGH (ref 70–99)
Potassium: 4.7 mmol/L (ref 3.5–5.1)
Sodium: 140 mmol/L (ref 135–145)

## 2019-08-23 LAB — GLUCOSE, CAPILLARY: Glucose-Capillary: 138 mg/dL — ABNORMAL HIGH (ref 70–99)

## 2019-08-23 MED ORDER — HYDRALAZINE HCL 20 MG/ML IJ SOLN
10.0000 mg | Freq: Once | INTRAMUSCULAR | Status: AC
  Administered 2019-08-23: 10 mg via INTRAVENOUS
  Filled 2019-08-23: qty 1

## 2019-08-23 MED ORDER — AMLODIPINE BESYLATE 10 MG PO TABS: 10.0000 mg | ORAL_TABLET | Freq: Every day | ORAL | 1 refills | Status: DC

## 2019-08-23 MED ORDER — ALBUTEROL SULFATE HFA 108 (90 BASE) MCG/ACT IN AERS: 2.0000 | INHALATION_SPRAY | Freq: Four times a day (QID) | RESPIRATORY_TRACT | 1 refills | Status: AC | PRN

## 2019-08-23 MED ORDER — CIPROFLOXACIN HCL 500 MG PO TABS: 500.0000 mg | ORAL_TABLET | Freq: Two times a day (BID) | ORAL | 0 refills | Status: AC

## 2019-08-23 MED ORDER — METRONIDAZOLE 500 MG PO TABS: 500.0000 mg | ORAL_TABLET | Freq: Three times a day (TID) | ORAL | 0 refills | Status: AC

## 2019-08-23 MED ORDER — AMLODIPINE BESYLATE 10 MG PO TABS
10.0000 mg | ORAL_TABLET | Freq: Every day | ORAL | Status: DC
  Administered 2019-08-23: 10 mg via ORAL
  Filled 2019-08-23: qty 1

## 2019-08-23 MED ORDER — IPRATROPIUM-ALBUTEROL 0.5-2.5 (3) MG/3ML IN SOLN
3.0000 mL | Freq: Four times a day (QID) | RESPIRATORY_TRACT | Status: DC | PRN
  Administered 2019-08-23: 10:00:00 3 mL via RESPIRATORY_TRACT
  Filled 2019-08-23: qty 3

## 2019-08-23 NOTE — Progress Notes (Signed)
Patient's foley catheter removed this morning.  Patient able to void this afternoon. Patient's IV removed.  Site WNL.  AVS reviewed with patient and patient's son.  Verbalized understanding of discharge instructions, physician follow-up, medications.  Patient transported by NT via wheelchair to main entrance at discharge.  Patient stable at time of discharge.  Report belongings in possession at discharge.

## 2019-08-23 NOTE — Progress Notes (Signed)
1104 - Patient's foley catheter removed.  Patient tolerated well.  Patient educated on need to void prior to patient discharge today.

## 2019-08-23 NOTE — Discharge Summary (Signed)
Physician Discharge Summary  Jacqueline Gamble GYJ:[id number] DOB: [birth year] DOA: 08/19/2019  PCP: Galen Manila, MD  Admit date: 08/19/2019 Discharge date: 08/23/2019  Admitted From: Home Disposition:  Home  Discharge Condition:Stable CODE STATUS:FULL, DNR, Comfort Care Diet recommendation: Heart Healthy / Carb Modified / Regular / Dysphagia   Brief/Interim Summary:  Patient is a 79 year old female with history of Parkinson's disease, diet-controlled diabetes, history of colon cancer,CKD , who presented to the emergency department with complaints of difficulty urination, lower abdominal pain/discomfort for 2 to 3 days.    On presentation, she was found to have a 62 mL of urine in the bladder.  Found to have acute kidney injury, leukocytosis, hypokalemia.  UA showed abnormal results with significant bacteriuria, leukocytes and large leukocyte esterase.  CT abdomen/pelvis done in the emergency department also showed possible duodenitis, cystitis.Urine culture showed E. coli.   Started on IV fluids for AKI on CKD.  Also started on IV antibiotics for urinary discomfort cystitis.  Currently she is feeling much better.  She does not complain of abdominal pain and she is tolerating diet.  Will change antibiotics to oral and she is hemodynamically stable for discharge today to home.  Following problems were addressed during her hospitalization:  UTI: Urine culture showing E. coli.  Blood culture NGTD.  CT abdomen/pelvis showed bilateral perinephric stranding, cystitis.  Possible duodenitis: Complained of pain on the mid/lower abdomen.  Denied any nausea or vomiting.  Diet advanced to soft and she is tolerating.  She does not have any bowel movement today.  GI pathogen panel did not come back.    Since she clinically improved, we changed antibiotics to oral.  CKD stage unknown/possible AKI: Creatinine was normal in 2017.  As per the sister, she has history of CKD but further detail is not available.   Urine sodium was less than 10.    Kidney function improved with IV fluids but plateaued around 2 which could be her baseline.  CT abdomen/pelvis did not show any hydronephrosis.  She was also on torsemide, lisinopril, irbesartan at home.  She used to follow-up with nephrology as an outpatient.  We recommend to follow-up with nephrology in 2 weeks with [facility].  Hypertension:  Hypertensive this morning.  On atenolol, ACE/ARB, torsemide at home.    Continue atenolol.  She was taking both irbesartan and lisinopril at home.  Will discontinue both.  Added amlodipine.  Diastolic CHF: Was dehydrated on presentation. echocardiogram showed ejection fraction of 26%, grade 1 diastolic dysfunction.  Continue torsemide 20 mg daily at home.  History of colon cancer:  Currently in remission.  Status post sigmoid colectomy.  Cognitive  Dysfunction/parkinsons disease: Very poor historian.  Lives with husband.  Able to ambulate at home.PT assessment done and there is no recommendation.  Hypokalemia: Supplemented with potassium.   Discharge Diagnoses:  Principal Problem:   Acute lower UTI Active Problems:   Hypertension   Acute urinary retention   AKI (acute kidney injury) ([place])   UTI (urinary tract infection)    Discharge Instructions  Discharge Instructions    Diet - low sodium heart healthy   Complete by: As directed    Discharge instructions   Complete by: As directed    1)Please follow up with your PCP in a week.  Do a CBC, BMP test during the follow-up 2) Take prescribed medications as instructed. 3)Follow up with nephrology as an outpatient.   Increase activity slowly   Complete by: As directed  Allergies as of 08/23/2019      Reactions   Iodine Swelling   Codeine Itching, Rash      Medication List    STOP taking these medications   irbesartan 150 MG tablet Commonly known as: AVAPRO   lisinopril 40 MG tablet Commonly known as: ZESTRIL     TAKE these medications    albuterol 108 (90 Base) MCG/ACT inhaler Commonly known as: VENTOLIN HFA Inhale 2 puffs into the lungs every 6 (six) hours as needed for wheezing or shortness of breath.   amLODipine 10 MG tablet Commonly known as: NORVASC Take 1 tablet (10 mg total) by mouth daily.   aspirin 81 MG tablet Take 81 mg by mouth daily.   atenolol 50 MG tablet Commonly known as: TENORMIN Take 50 mg by mouth daily.   carbidopa-levodopa 25-100 MG tablet Commonly known as: SINEMET IR Take 1 tablet by mouth 3 (three) times daily.   ciprofloxacin 500 MG tablet Commonly known as: Cipro Take 1 tablet (500 mg total) by mouth 2 (two) times daily for 5 days.   dicyclomine 10 MG capsule Commonly known as: BENTYL TAKE ONE CAPSULE BY MOUTH THREE TIMES A DAY BEFORE MEALS   levothyroxine 125 MCG tablet Commonly known as: SYNTHROID Take 62.5 mcg by mouth daily before breakfast.   metroNIDAZOLE 500 MG tablet Commonly known as: Flagyl Take 1 tablet (500 mg total) by mouth 3 (three) times daily for 5 days.   ondansetron 4 MG tablet Commonly known as: ZOFRAN Take 1 tablet (4 mg total) by mouth every 6 (six) hours. What changed:   when to take this  reasons to take this   pantoprazole 40 MG tablet Commonly known as: PROTONIX Take 1 tablet (40 mg total) by mouth 2 (two) times daily.   Stool Softener 100 MG capsule Generic drug: docusate sodium Take 100 mg by mouth daily.   torsemide 20 MG tablet Commonly known as: DEMADEX Take 20 mg by mouth daily.      Follow-up Information    Galen Manila, MD. Schedule an appointment as soon as possible for a visit in 1 week(s).   Specialty: Internal Medicine Contact information: [address] [place] [address] [phone]          Allergies  Allergen Reactions  . Iodine Swelling  . Codeine Itching and Rash    Consultations:  None   Procedures/Studies: CT ABDOMEN PELVIS WO CONTRAST  Result Date: 08/20/2019 CLINICAL DATA:   79 year old female with lower abdominal pain. EXAM: CT ABDOMEN AND PELVIS WITHOUT CONTRAST TECHNIQUE: Multidetector CT imaging of the abdomen and pelvis was performed following the standard protocol without IV contrast. COMPARISON:  CT of the abdomen pelvis dated 04/28/2016. FINDINGS: Evaluation of this exam is limited in the absence of intravenous contrast. Lower chest: The visualized lung bases are clear. Coronary vascular calcification noted. No intra-abdominal free air or free fluid. Hepatobiliary: Subcentimeter hypodense focus in the left lobe of the liver is not well characterized. The liver is otherwise unremarkable. Pneumobilia noted primarily in the left intrahepatic biliary trees, new since the prior CT. Cholecystectomy. Pancreas: Unremarkable. No pancreatic ductal dilatation or surrounding inflammatory changes. Spleen: Normal in size without focal abnormality. Adrenals/Urinary Tract: The adrenal glands are unremarkable. There is a 5.2 x 6.5 cm lobulated, probable multi septated cyst in the upper pole of the right kidney with thin linear calcification. A 2.5 cm inferior pole cyst is also noted. These can be better evaluated with ultrasound on a nonemergent basis. There  is no hydronephrosis or nephrolithiasis on either side. Bilateral perinephric stranding, nonspecific. Correlation with urinalysis recommended to evaluate for pyelonephritis. The visualized ureters appear unremarkable. There is a small pocket of air within the urinary bladder which may be related to recent instrumentation or infection. Stomach/Bowel: There is a small hiatal hernia. 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 Mild haziness and stranding of the fat surrounding the second portion of the duodenum noted which may represent mild duodenitis or mild inflammatory changes of the duodenal diverticula. There is no bowel obstruction. Appendectomy. Vascular/Lymphatic: Mild aortoiliac atherosclerotic disease. The IVC is unremarkable. No portal  venous gas. Reproductive: Hysterectomy. Other: None Musculoskeletal: Degenerative changes of the spine with multilevel disc desiccation and vacuum phenomena. No acute osseous pathology. Grade 1 L4-L5 anterolisthesis. IMPRESSION: 1. 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 Mild haziness and stranding of the fat surrounding the second portion of the duodenum may represent mild duodenitis or mild inflammatory changes of the duodenal diverticula. No bowel obstruction. 2. Pneumobilia, new since the prior CT. Correlation with clinical exam and liver function tests recommended. 3. Bilateral renal cysts as described. These can be better evaluated with ultrasound on a nonemergent basis. 4. Bilateral perinephric stranding. Correlation with urinalysis recommended to exclude cystitis. 5. Aortic Atherosclerosis (ICD10-I70.0). Electronically Signed   By: Anner Crete M.D.   On: 08/20/2019 12:36   ECHOCARDIOGRAM COMPLETE  Result Date: 08/22/2019   ECHOCARDIOGRAM REPORT   Patient Name:   LASHANDA STORLIE Date of Exam: 08/22/2019 Medical Rec #:  [id number]      Height:       60.0 in Accession #:    [id number]     Weight:       195.0 lb Date of Birth:  10-10-1940      BSA:          1.85 m Patient Age:    42 years       BP:           140/90 mmHg Patient Gender: F              HR:           81 bpm. Exam Location:  Inpatient Procedure: 2D Echo, Cardiac Doppler and Color Doppler Indications:    I50.33 Acute on chronic diastolic (congestive) heart failure  History:        Patient has no prior history of Echocardiogram examinations.                 Risk Factors:Hypertension and Dyslipidemia. Cancer. GERD.                 Hypothyroidism.  Sonographer:    Jonelle Sidle Dance Referring Phys: [id number] Teofila Bowery IMPRESSIONS  1. Left ventricular ejection fraction, by visual estimation, is 50 to 55%. The left ventricle has low normal function. There is mildly increased left ventricular hypertrophy.  2. Left ventricular diastolic parameters are  consistent with Grade I diastolic dysfunction (impaired relaxation).  3. The left ventricle has no regional wall motion abnormalities.  4. Global right ventricle has normal systolic function.The right ventricular size is normal. No increase in right ventricular wall thickness.  5. Left atrial size was moderately dilated.  6. Right atrial size was normal.  7. Mild mitral annular calcification.  8. The mitral valve is abnormal. Trivial mitral valve regurgitation.  9. The tricuspid valve is grossly normal. 10. The aortic valve is tricuspid. Aortic valve regurgitation is trivial. 11. The pulmonic valve was not well visualized. Pulmonic valve regurgitation  is not visualized. 12. Normal pulmonary artery systolic pressure. 13. The inferior vena cava is dilated in size with >50% respiratory variability, suggesting right atrial pressure of 8 mmHg. 14. Small pericardial effusion. 15. The pericardial effusion is posterior. FINDINGS  Left Ventricle: Left ventricular ejection fraction, by visual estimation, is 50 to 55%. The left ventricle has low normal function. The left ventricle has no regional wall motion abnormalities. There is mildly increased left ventricular hypertrophy. Left ventricular diastolic parameters are consistent with Grade I diastolic dysfunction (impaired relaxation). Indeterminate filling pressures. Right Ventricle: The right ventricular size is normal. No increase in right ventricular wall thickness. Global RV systolic function is has normal systolic function. The tricuspid regurgitant velocity is 2.33 m/s, and with an assumed right atrial pressure  of 8 mmHg, the estimated right ventricular systolic pressure is normal at 29.6 mmHg. Left Atrium: Left atrial size was moderately dilated. Right Atrium: Right atrial size was normal in size Pericardium: A small pericardial effusion is present. The pericardial effusion is posterior. Mitral Valve: The mitral valve is abnormal. There is mild thickening of the  mitral valve leaflet(s). Mild mitral annular calcification. Trivial mitral valve regurgitation. Tricuspid Valve: The tricuspid valve is grossly normal. Tricuspid valve regurgitation is trivial. Aortic Valve: The aortic valve is tricuspid. Aortic valve regurgitation is trivial. Aortic regurgitation PHT measures 771 msec. Pulmonic Valve: The pulmonic valve was not well visualized. Pulmonic valve regurgitation is not visualized. Pulmonic regurgitation is not visualized. Aorta: The aortic root and ascending aorta are structurally normal, with no evidence of dilitation. Venous: The inferior vena cava is dilated in size with greater than 50% respiratory variability, suggesting right atrial pressure of 8 mmHg. IAS/Shunts: No atrial level shunt detected by color flow Doppler.  LEFT VENTRICLE PLAX 2D LVIDd:         4.50 cm LVIDs:         3.50 cm LV PW:         1.20 cm LV IVS:        1.00 cm LVOT diam:     1.80 cm LV SV:         42 ml LV SV Index:   20.97 LVOT Area:     2.54 cm  RIGHT VENTRICLE             IVC RV Basal diam:  2.30 cm     IVC diam: 2.40 cm RV S prime:     12.80 cm/s TAPSE (M-mode): 2.5 cm LEFT ATRIUM             Index       RIGHT ATRIUM           Index LA diam:        5.00 cm 2.71 cm/m  RA Area:     13.70 cm LA Vol (A2C):   76.5 ml 41.43 ml/m RA Volume:   31.00 ml  16.79 ml/m LA Vol (A4C):   83.2 ml 45.06 ml/m LA Biplane Vol: 80.8 ml 43.76 ml/m  AORTIC VALVE LVOT Vmax:   97.90 cm/s LVOT Vmean:  62.900 cm/s LVOT VTI:    0.186 m AI PHT:      771 msec  AORTA Ao Root diam: 3.50 cm Ao Asc diam:  3.00 cm MITRAL VALVE                        TRICUSPID VALVE MV Area (PHT): 2.42 cm  TR Peak grad:   21.6 mmHg MV PHT:        90.77 msec           TR Vmax:        240.00 cm/s MV Decel Time: 313 msec MV E velocity: 69.00 cm/s 103 cm/s  SHUNTS MV A velocity: 97.40 cm/s 70.3 cm/s Systemic VTI:  0.19 m MV E/A ratio:  0.71       1.5       Systemic Diam: 1.80 cm  Lyman Bishop MD Electronically signed by Lyman Bishop MD Signature Date/Time: 08/22/2019/4:10:00 PM    Final        Subjective: Patient seen and examined at the bedside this morning.  Hemodynamically stable for discharge today.  Discharge Exam: Vitals:   08/23/19 0821 08/23/19 1014  BP: (!) 157/85   Pulse: 86   Resp:    Temp:    SpO2: 95% 97%   Vitals:   08/22/19 2143 08/23/19 0525 08/23/19 0821 08/23/19 1014  BP: (!) 175/87 (!) 174/87 (!) 157/85   Pulse: 84 79 86   Resp: (!) 21 20    Temp: (!) 97.5 F (36.4 C) 98.1 F (36.7 C)    TempSrc: Oral Oral    SpO2: 99% 98% 95% 97%  Weight:      Height:        General: Pt is alert, awake, not in acute distress Cardiovascular: RRR, S1/S2 +, no rubs, no gallops Respiratory: CTA bilaterally, no wheezing, no rhonchi Abdominal: Soft, NT, ND, bowel sounds + Extremities: no edema, no cyanosis    The results of significant diagnostics from this hospitalization (including imaging, microbiology, ancillary and laboratory) are listed below for reference.     Microbiology: Recent Results (from the past 240 hour(s))  Urine culture     Status: Abnormal   Collection Time: 08/19/19  9:37 PM   Specimen: Urine, Catheterized  Result Value Ref Range Status   Specimen Description   Final    URINE, CATHETERIZED Performed at [facility] [address] [place], [place] [address]    Special Requests   Final    NONE Performed at [facility], [place] [address] [place], [place] [address]    Culture >=100,000 COLONIES/mL ESCHERICHIA COLI (A)  Final   Report Status 08/22/2019 FINAL  Final   Organism ID, Bacteria ESCHERICHIA COLI (A)  Final      Susceptibility   Escherichia coli - MIC*    AMPICILLIN >=32 RESISTANT Resistant     CEFAZOLIN <=4 SENSITIVE Sensitive     CEFTRIAXONE <=0.25 SENSITIVE Sensitive     CIPROFLOXACIN <=0.25 SENSITIVE Sensitive     GENTAMICIN <=1 SENSITIVE Sensitive     IMIPENEM <=0.25 SENSITIVE Sensitive     NITROFURANTOIN  <=16 SENSITIVE Sensitive     TRIMETH/SULFA <=20 SENSITIVE Sensitive     AMPICILLIN/SULBACTAM >=32 RESISTANT Resistant     PIP/TAZO <=4 SENSITIVE Sensitive     * >=100,000 COLONIES/mL ESCHERICHIA COLI  SARS CORONAVIRUS 2 (TAT 6-24 HRS) Nasopharyngeal Nasopharyngeal Swab     Status: None   Collection Time: 08/21/19  2:36 PM   Specimen: Nasopharyngeal Swab  Result Value Ref Range Status   SARS Coronavirus 2 NEGATIVE NEGATIVE Final    Comment: (NOTE) SARS-CoV-2 target nucleic acids are NOT DETECTED. The SARS-CoV-2 RNA is generally detectable in upper and lower respiratory specimens during the acute phase of infection. Negative results do not preclude SARS-CoV-2 infection, do not rule out co-infections with other  pathogens, and should not be used as the sole basis for treatment or other patient management decisions. Negative results must be combined with clinical observations, patient history, and epidemiological information. The expected result is Negative. Fact Sheet for Patients: SugarRoll.be Fact Sheet for Healthcare Providers: https://www.woods-mathews.com/ This test is not yet approved or cleared by the [place] FDA and  has been authorized for detection and/or diagnosis of SARS-CoV-2 by FDA under an Emergency Use Authorization (EUA). This EUA will remain  in effect (meaning this test can be used) for the duration of the COVID-19 declaration under Section 56 4(b)(1) of the Act, 21 U.S.C. section 360bbb-3(b)(1), unless the authorization is terminated or revoked sooner. Performed at [facility], [place] [address], [place], [place] [address]   Culture, blood (routine x 2)     Status: None (Preliminary result)   Collection Time: 08/21/19  3:28 PM   Specimen: BLOOD LEFT HAND  Result Value Ref Range Status   Specimen Description   Final    BLOOD LEFT HAND Performed at [facility] [address], [place],  [place] [address]    Special Requests   Final    BOTTLES DRAWN AEROBIC AND ANAEROBIC Blood Culture adequate volume Performed at [facility] [address], [place], [place] [address]    Culture   Final    NO GROWTH < 24 HOURS Performed at [facility] [address], [place], [place] [address]    Report Status PENDING  Incomplete  Culture, blood (routine x 2)     Status: None (Preliminary result)   Collection Time: 08/21/19  3:28 PM   Specimen: BLOOD  Result Value Ref Range Status   Specimen Description   Final    BLOOD RIGHT ANTECUBITAL Performed at [facility] [address], [place], [place] [address]    Special Requests   Final    BOTTLES DRAWN AEROBIC AND ANAEROBIC Blood Culture adequate volume Performed at [facility] [address], [place], [place] [address]    Culture   Final    NO GROWTH < 24 HOURS Performed at [facility] [address] [place], [place] [address]    Report Status PENDING  Incomplete     Labs: BNP (last 3 results) No results for input(s): BNP in the last 8760 hours. Basic Metabolic Panel: Recent Labs  Lab 08/19/19 1940 08/20/19 0515 08/21/19 0241 08/22/19 0300 08/23/19 0312  NA 135 136 133* 134* 140  K 2.9* 3.9 3.5 3.4* 4.7  CL 96* 100 100 102 109  CO2 25 23 21* 21* 23  GLUCOSE 194* 161* 111* 126* 121*  BUN 33* 40* 54* 53* 46*  CREATININE 2.41* 2.69* 3.18* 2.75* 2.39*  CALCIUM 8.7* 8.2* 8.1* 8.1* 8.3*   Liver Function Tests: Recent Labs  Lab 08/19/19 1940 08/21/19 0241  AST 20 12*  ALT 17 <5  ALKPHOS 94 100  BILITOT 1.3* 0.8  PROT 6.0* 5.2*  ALBUMIN 2.8* 2.2*   Recent Labs  Lab 08/19/19 1940  LIPASE 14   No results for input(s): AMMONIA in the last 168 hours. CBC: Recent Labs  Lab 08/19/19 1940 08/20/19 0515 08/21/19 0241 08/22/19 0300  WBC 17.7* 16.5* 14.7* 11.1*  NEUTROABS  --   --   --  9.2*  HGB 14.1 13.1 12.3 11.8*  HCT 43.2 40.9 37.5 36.6   MCV 91.7 93.6 92.1 91.3  PLT 200 187 174 194   Cardiac Enzymes:  No results for input(s): CKTOTAL, CKMB, CKMBINDEX, TROPONINI in the last 168 hours. BNP: Invalid input(s): POCBNP CBG: No results for input(s): GLUCAP in the last 168 hours. D-Dimer No results for input(s): DDIMER in the last 72 hours. Hgb A1c No results for input(s): HGBA1C in the last 72 hours. Lipid Profile No results for input(s): CHOL, HDL, LDLCALC, TRIG, CHOLHDL, LDLDIRECT in the last 72 hours. Thyroid function studies No results for input(s): TSH, T4TOTAL, T3FREE, THYROIDAB in the last 72 hours.  Invalid input(s): FREET3 Anemia work up No results for input(s): VITAMINB12, FOLATE, FERRITIN, TIBC, IRON, RETICCTPCT in the last 72 hours. Urinalysis    Component Value Date/Time   COLORURINE AMBER (A) 08/19/2019 1901   APPEARANCEUR CLOUDY (A) 08/19/2019 1901   LABSPEC 1.011 08/19/2019 1901   PHURINE 5.0 08/19/2019 1901   GLUCOSEU NEGATIVE 08/19/2019 1901   HGBUR MODERATE (A) 08/19/2019 1901   BILIRUBINUR NEGATIVE 08/19/2019 1901   KETONESUR NEGATIVE 08/19/2019 1901   PROTEINUR 100 (A) 08/19/2019 1901   NITRITE NEGATIVE 08/19/2019 1901   LEUKOCYTESUR LARGE (A) 08/19/2019 1901   Sepsis Labs Invalid input(s): PROCALCITONIN,  WBC,  LACTICIDVEN Microbiology Recent Results (from the past 240 hour(s))  Urine culture     Status: Abnormal   Collection Time: 08/19/19  9:37 PM   Specimen: Urine, Catheterized  Result Value Ref Range Status   Specimen Description   Final    URINE, CATHETERIZED Performed at [facility], [place] [address], [place], [place] [address]    Special Requests   Final    NONE Performed at [facility], [place] [address] [place], [place] [address]    Culture >=100,000 COLONIES/mL ESCHERICHIA COLI (A)  Final   Report Status 08/22/2019 FINAL  Final   Organism ID, Bacteria ESCHERICHIA COLI (A)  Final      Susceptibility   Escherichia coli - MIC*     AMPICILLIN >=32 RESISTANT Resistant     CEFAZOLIN <=4 SENSITIVE Sensitive     CEFTRIAXONE <=0.25 SENSITIVE Sensitive     CIPROFLOXACIN <=0.25 SENSITIVE Sensitive     GENTAMICIN <=1 SENSITIVE Sensitive     IMIPENEM <=0.25 SENSITIVE Sensitive     NITROFURANTOIN <=16 SENSITIVE Sensitive     TRIMETH/SULFA <=20 SENSITIVE Sensitive     AMPICILLIN/SULBACTAM >=32 RESISTANT Resistant     PIP/TAZO <=4 SENSITIVE Sensitive     * >=100,000 COLONIES/mL ESCHERICHIA COLI  SARS CORONAVIRUS 2 (TAT 6-24 HRS) Nasopharyngeal Nasopharyngeal Swab     Status: None   Collection Time: 08/21/19  2:36 PM   Specimen: Nasopharyngeal Swab  Result Value Ref Range Status   SARS Coronavirus 2 NEGATIVE NEGATIVE Final    Comment: (NOTE) SARS-CoV-2 target nucleic acids are NOT DETECTED. The SARS-CoV-2 RNA is generally detectable in upper and lower respiratory specimens during the acute phase of infection. Negative results do not preclude SARS-CoV-2 infection, do not rule out co-infections with other pathogens, and should not be used as the sole basis for treatment or other patient management decisions. Negative results must be combined with clinical observations, patient history, and epidemiological information. The expected result is Negative. Fact Sheet for Patients: SugarRoll.be Fact Sheet for Healthcare Providers: https://www.woods-mathews.com/ This test is not yet approved or cleared by the [place] FDA and  has been authorized for detection and/or diagnosis of SARS-CoV-2 by FDA under an Emergency Use Authorization (EUA). This EUA will remain  in effect (meaning this test can be used) for the duration of the COVID-19 declaration under Section 56 4(b)(1)  of the Act, 21 U.S.C. section 360bbb-3(b)(1), unless the authorization is terminated or revoked sooner. Performed at [facility], [place] [address], [place], [place] [address]   Culture, blood (routine x 2)      Status: None (Preliminary result)   Collection Time: 08/21/19  3:28 PM   Specimen: BLOOD LEFT HAND  Result Value Ref Range Status   Specimen Description   Final    BLOOD LEFT HAND Performed at [facility] [address], [place], [place] [address]    Special Requests   Final    BOTTLES DRAWN AEROBIC AND ANAEROBIC Blood Culture adequate volume Performed at [facility] [address], [place], [place] [address]    Culture   Final    NO GROWTH < 24 HOURS Performed at [facility] [address] [place], [place] [address]    Report Status PENDING  Incomplete  Culture, blood (routine x 2)     Status: None (Preliminary result)   Collection Time: 08/21/19  3:28 PM   Specimen: BLOOD  Result Value Ref Range Status   Specimen Description   Final    BLOOD RIGHT ANTECUBITAL Performed at [facility] [address] [place], [place] [address]    Special Requests   Final    BOTTLES DRAWN AEROBIC AND ANAEROBIC Blood Culture adequate volume Performed at [facility] [address], [place], [place] [address]    Culture   Final    NO GROWTH < 24 HOURS Performed at [facility] [address], [place], [place] [address]    Report Status PENDING  Incomplete    Please note: You were cared for by a hospitalist during your hospital stay. Once you are discharged, your primary care physician will handle any further medical issues. Please note that NO REFILLS for any discharge medications will be authorized once you are discharged, as it is imperative that you return to your primary care physician (or establish a relationship with a primary care physician if you do not have one) for your post hospital discharge needs so that they can reassess your need for medications and monitor your lab values.    Time coordinating discharge: 40 minutes  SIGNED:   Shelly Coss, MD  [facility] 08/23/2019,  10:45 AM Pager [phone]  If 7PM-7AM, please contact night-coverage [email or web address] Password [email or web address]

## 2019-08-25 LAB — GI PATHOGEN PANEL BY PCR, STOOL

## 2019-08-26 LAB — CULTURE, BLOOD (ROUTINE X 2)
Culture: NO GROWTH
Culture: NO GROWTH
Special Requests: ADEQUATE
Special Requests: ADEQUATE

## 2019-09-03 ENCOUNTER — Encounter: Payer: Medicare Other | Admitting: Gastroenterology

## 2019-09-26 ENCOUNTER — Other Ambulatory Visit (HOSPITAL_COMMUNITY): Payer: Self-pay | Admitting: Nephrology

## 2019-09-26 ENCOUNTER — Other Ambulatory Visit: Payer: Self-pay | Admitting: Nephrology

## 2019-09-26 DIAGNOSIS — N184 Chronic kidney disease, stage 4 (severe): Secondary | ICD-10-CM

## 2019-10-02 ENCOUNTER — Other Ambulatory Visit: Payer: Self-pay

## 2019-10-02 ENCOUNTER — Ambulatory Visit (HOSPITAL_COMMUNITY)
Admission: RE | Admit: 2019-10-02 | Discharge: 2019-10-02 | Disposition: A | Discharge status: Home / Self Care | Payer: Medicare Other | Source: Ambulatory Visit | Attending: Nephrology | Admitting: Nephrology

## 2019-10-02 DIAGNOSIS — N184 Chronic kidney disease, stage 4 (severe): Secondary | ICD-10-CM

## 2019-10-07 ENCOUNTER — Other Ambulatory Visit: Payer: Self-pay | Admitting: Nephrology

## 2019-10-07 ENCOUNTER — Other Ambulatory Visit (HOSPITAL_COMMUNITY): Payer: Self-pay | Admitting: Nephrology

## 2019-10-07 DIAGNOSIS — N281 Cyst of kidney, acquired: Secondary | ICD-10-CM

## 2019-10-18 ENCOUNTER — Other Ambulatory Visit: Payer: Self-pay

## 2019-10-18 ENCOUNTER — Ambulatory Visit (HOSPITAL_COMMUNITY)
Admission: RE | Admit: 2019-10-18 | Discharge: 2019-10-18 | Disposition: A | Discharge status: Home / Self Care | Payer: Medicare Other | Source: Ambulatory Visit | Attending: Nephrology | Admitting: Nephrology

## 2019-10-18 DIAGNOSIS — N281 Cyst of kidney, acquired: Secondary | ICD-10-CM

## 2019-11-21 ENCOUNTER — Encounter: Payer: Self-pay | Admitting: Gastroenterology

## 2019-12-02 ENCOUNTER — Ambulatory Visit (AMBULATORY_SURGERY_CENTER): Payer: Self-pay | Admitting: *Deleted

## 2019-12-02 ENCOUNTER — Other Ambulatory Visit: Payer: Self-pay

## 2019-12-02 VITALS — Temp 97.0°F | Ht 60.0 in | Wt 197.0 lb

## 2019-12-02 DIAGNOSIS — Z85038 Personal history of other malignant neoplasm of large intestine: Secondary | ICD-10-CM

## 2019-12-02 MED ORDER — NA SULFATE-K SULFATE-MG SULF 17.5-3.13-1.6 GM/177ML PO SOLN: 1.0000 | Freq: Once | ORAL | 0 refills | Status: AC

## 2019-12-02 NOTE — Progress Notes (Signed)

## 2019-12-12 ENCOUNTER — Encounter: Payer: Medicare Other | Admitting: Gastroenterology

## 2020-01-08 ENCOUNTER — Encounter: Payer: Self-pay | Admitting: Gastroenterology

## 2020-01-08 ENCOUNTER — Other Ambulatory Visit: Payer: Self-pay

## 2020-01-08 ENCOUNTER — Ambulatory Visit (AMBULATORY_SURGERY_CENTER): Payer: Medicare Other | Admitting: Gastroenterology

## 2020-01-08 VITALS — BP 140/83 | HR 294 | Temp 97.1°F | Resp 14 | Ht 60.0 in | Wt 197.0 lb

## 2020-01-08 DIAGNOSIS — D122 Benign neoplasm of ascending colon: Secondary | ICD-10-CM

## 2020-01-08 DIAGNOSIS — D123 Benign neoplasm of transverse colon: Secondary | ICD-10-CM

## 2020-01-08 DIAGNOSIS — Z86010 Personal history of colonic polyps: Secondary | ICD-10-CM

## 2020-01-08 DIAGNOSIS — Z85038 Personal history of other malignant neoplasm of large intestine: Secondary | ICD-10-CM

## 2020-01-08 HISTORY — PX: COLONOSCOPY: SHX174

## 2020-01-08 MED ORDER — SODIUM CHLORIDE 0.9 % IV SOLN: 500.0000 mL | Freq: Once | INTRAVENOUS | Status: DC

## 2020-01-08 NOTE — Progress Notes (Signed)
Called to room to assist during endoscopic procedure.  Patient ID and intended procedure confirmed with present staff. Received instructions for my participation in the procedure from the performing physician.  

## 2020-01-08 NOTE — Progress Notes (Signed)
Patient has a history of falls.  Both the husband and daughter are at the bedside.  Daughter helped patient get dressed.  Patient did well.  Patient insists on going to the bathroom by herself.

## 2020-01-08 NOTE — Op Note (Signed)
[facility] Patient Name: Jacqueline Gamble Procedure Date: 01/08/2020 11:16 AM MRN: [id number] Endoscopist: Ladene Artist , MD Age: 79 Referring MD:  Date of Birth: 11-22-1940 Gender: Female Account #: [id number] Procedure:                Colonoscopy Indications:              Surveillance: Piecemeal removal of large sessile                            adenoma last colonoscopy (< 3 yrs). Personal                            history of colon cancer. Medicines:                Monitored Anesthesia Care Procedure:                Pre-Anesthesia Assessment:                           - Prior to the procedure, a History and Physical                            was performed, and patient medications and                            allergies were reviewed. The patient's tolerance of                            previous anesthesia was also reviewed. The risks                            and benefits of the procedure and the sedation                            options and risks were discussed with the patient.                            All questions were answered, and informed consent                            was obtained. Prior Anticoagulants: The patient has                            taken no previous anticoagulant or antiplatelet                            agents. ASA Grade Assessment: II - A patient with                            mild systemic disease. After reviewing the risks                            and benefits, the patient was deemed in  satisfactory condition to undergo the procedure.                           After obtaining informed consent, the colonoscope                            was passed under direct vision. Throughout the                            procedure, the patient's blood pressure, pulse, and                            oxygen saturations were monitored continuously. The                            Colonoscope was introduced through the  anus and                            advanced to the the cecum, identified by                            appendiceal orifice and ileocecal valve. The                            ileocecal valve, appendiceal orifice, and rectum                            were photographed. The quality of the bowel                            preparation was adequate. The patient tolerated the                            procedure well. The colonoscopy was somewhat                            difficult [Reason]. [Solution]. Scope In: 11:30:52 AM Scope Out: 11:59:33 AM Scope Withdrawal Time: 0 hours 25 minutes 6 seconds  Total Procedure Duration: 0 hours 28 minutes 41 seconds  Findings:                 The perianal and digital rectal examinations were                            normal.                           Two sessile polyps were found in the ascending                            colon. The polyps were 3 to 4 mm in size. These                            polyps were removed with a cold biopsy forceps.  Resection and retrieval were complete.                           A 28 mm polyp was found in the transverse colon.                            The polyp was sessile. The polyp was removed with a                            piecemeal technique using a hot snare. Technically                            difficult on a curve with respiratory motion.                            Resection and retrieval were complete.                           Tattoos were seen in the transverse colon. The                            tattoo site appeared abnormal with the above polyp                            at the proximal edge of the tattoos.                           Multiple medium-mouthed diverticula were found in                            the left colon. There was no evidence of                            diverticular bleeding.                           There was evidence of a prior end-to-end                             colo-colonic anastomosis in the sigmoid colon. This                            was patent and was characterized by healthy                            appearing mucosa. The anastomosis was traversed.                           Internal hemorrhoids were found during                            retroflexion. The hemorrhoids were small and Grade  I (internal hemorrhoids that do not prolapse).                           The exam was otherwise without abnormality on                            direct and retroflexion views. Complications:            No immediate complications. Estimated blood loss:                            None. Estimated Blood Loss:     Estimated blood loss: none. Impression:               - One 28 mm polyp in the transverse colon, removed                            piecemeal using a hot snare. Resected and retrieved.                           - Two sessile polyps in the ascending colon,                            removed by cold forceps. Resected and retrieved.                           - Tattoos were seen in the transverse colon. The                            tattoo site appeared abnormal with above 28 mm                            polyp on proximal end of tattoos.                           - Moderate diverticulosis in the left colon.                           - Patent end-to-end colo-colonic anastomosis,                            characterized by healthy appearing mucosa.                           - Internal hemorrhoids.                           - The examination was otherwise normal on direct                            and retroflexion views. Recommendation:           - Repeat colonoscopy in 6 months for surveillance                            after piecemeal polypectomy.                           -  Patient has a contact number available for                            emergencies. The signs and symptoms of potential                             delayed complications were discussed with the                            patient. Return to normal activities tomorrow.                            Written discharge instructions were provided to the                            patient.                           - Resume previous diet.                           - Continue present medications.                           - Await pathology results.                           - No aspirin, ibuprofen, naproxen, or other                            non-steroidal anti-inflammatory drugs for 2 weeks                            after polyp removal. Ladene Artist, MD 01/08/2020 12:13:23 PM This report has been signed electronically.

## 2020-01-08 NOTE — Progress Notes (Signed)
Report given to PACU, vss 

## 2020-01-08 NOTE — Progress Notes (Signed)
Pt's states no medical or surgical changes since previsit or office visit.  Temp- June Vitals- Donna 

## 2020-01-08 NOTE — Patient Instructions (Signed)
No NSAIDS: aspirin, aleve, ibuprofen for two weeks post procedure.   You will need another colonoscopy in 6 months.  Read all handouts given to you by your recovery room nurse.  YOU HAD AN ENDOSCOPIC PROCEDURE TODAY AT [facility]:   Refer to the procedure report that was given to you for any specific questions about what was found during the examination.  If the procedure report does not answer your questions, please call your gastroenterologist to clarify.  If you requested that your care partner not be given the details of your procedure findings, then the procedure report has been included in a sealed envelope for you to review at your convenience later.  YOU SHOULD EXPECT: Some feelings of bloating in the abdomen. Passage of more gas than usual.  Walking can help get rid of the air that was put into your GI tract during the procedure and reduce the bloating. If you had a lower endoscopy (such as a colonoscopy or flexible sigmoidoscopy) you may notice spotting of blood in your stool or on the toilet paper. If you underwent a bowel prep for your procedure, you may not have a normal bowel movement for a few days.  Please Note:  You might notice some irritation and congestion in your nose or some drainage.  This is from the oxygen used during your procedure.  There is no need for concern and it should clear up in a day or so.  SYMPTOMS TO REPORT IMMEDIATELY:   Following lower endoscopy (colonoscopy or flexible sigmoidoscopy):  Excessive amounts of blood in the stool  Significant tenderness or worsening of abdominal pains  Swelling of the abdomen that is new, acute  Fever of 100F or higher   For urgent or emergent issues, a gastroenterologist can be reached at any hour by calling [phone]. Do not use MyChart messaging for urgent concerns.    DIET:  We do recommend a small meal at first, but then you may proceed to your regular diet.  Drink plenty of fluids but you  should avoid alcoholic beverages for 24 hours.  ACTIVITY:  You should plan to take it easy for the rest of today and you should NOT DRIVE or use heavy machinery until tomorrow (because of the sedation medicines used during the test).    FOLLOW UP: Our staff will call the number listed on your records 48-72 hours following your procedure to check on you and address any questions or concerns that you may have regarding the information given to you following your procedure. If we do not reach you, we will leave a message.  We will attempt to reach you two times.  During this call, we will ask if you have developed any symptoms of COVID 19. If you develop any symptoms (ie: fever, flu-like symptoms, shortness of breath, cough etc.) before then, please call [phone].  If you test positive for Covid 19 in the 2 weeks post procedure, please call and report this information to [place].    If any biopsies were taken you will be contacted by phone or by letter within the next 1-3 weeks.  Please call us at [phone] if you have not heard about the biopsies in 3 weeks.    SIGNATURES/CONFIDENTIALITY: You and/or your care partner have signed paperwork which will be entered into your electronic medical record.  These signatures attest to the fact that that the information above on your After Visit Summary has been reviewed and is understood.  Full responsibility of the confidentiality of this discharge information lies with you and/or your care-partner.

## 2020-01-10 ENCOUNTER — Telehealth: Payer: Self-pay | Admitting: *Deleted

## 2020-01-10 NOTE — Telephone Encounter (Signed)
  Follow up Call-  Call back number 01/08/2020 08/07/2018 01/18/2018  Post procedure Call Back phone  # [phone]  Permission to leave phone message Yes Yes Yes  Some recent data might be hidden     Patient questions:  Do you have a fever, pain , or abdominal swelling? No. Pain Score  0 *  Have you tolerated food without any problems? Yes.    Have you been able to return to your normal activities? Yes.    Do you have any questions about your discharge instructions: Diet   No. Medications  No. Follow up visit  No.  Do you have questions or concerns about your Care? No.  Actions: * If pain score is 4 or above: No action needed, pain <4.  1. Have you developed a fever since your procedure? NO  2.   Have you had an respiratory symptoms (SOB or cough) since your procedure? NO  3.   Have you tested positive for COVID 19 since your procedure NO  4.   Have you had any family members/close contacts diagnosed with the COVID 19 since your procedure?  NO   If yes to any of these questions please route to Joylene John, RN and Erenest Rasher, RN

## 2020-01-17 ENCOUNTER — Telehealth: Payer: Self-pay | Admitting: Gastroenterology

## 2020-01-17 NOTE — Telephone Encounter (Signed)
Sheri I think this is Dr. Lynne Leader patient Norberto Sorenson, [place]

## 2020-01-17 NOTE — Telephone Encounter (Signed)
Agree with urgent ED evaluation of GI bleeding, possible post polypectomy bleed. Pt declined at this time. Must seek care urgently at ED if bleeding continues.

## 2020-01-17 NOTE — Telephone Encounter (Signed)
Patient reports that she had 3 episodes of bleeding last night with BM/  Blood fills the bowl.  She has not had another BM today.  She feels "so tired".  She has no other symptoms of light headed, dizziness, CP, or SOB.  She is advised she should go to the ED for eval and explained about a post -polypectomy bleed.  She "want to wait and see if my bowels move again".  She is advised that she could have another bleeding episode and should not wait.  She verbalizes understanding but "wants to wait"

## 2020-01-17 NOTE — Telephone Encounter (Signed)
Patient called states she had a BM twice since her procedure on 01/08/20 and both times just jelly like blood came out and that is the only time she bleeds.

## 2020-01-18 ENCOUNTER — Other Ambulatory Visit: Payer: Self-pay

## 2020-01-18 ENCOUNTER — Encounter (HOSPITAL_COMMUNITY): Payer: Self-pay | Admitting: Family Medicine

## 2020-01-18 ENCOUNTER — Observation Stay (HOSPITAL_COMMUNITY)
Admission: EM | Admit: 2020-01-18 | Discharge: 2020-01-19 | Disposition: A | Discharge status: Home / Self Care | Payer: Medicare Other | Attending: Internal Medicine | Admitting: Internal Medicine

## 2020-01-18 DIAGNOSIS — Z86010 Personal history of colonic polyps: Secondary | ICD-10-CM | POA: Insufficient documentation

## 2020-01-18 DIAGNOSIS — Z9889 Other specified postprocedural states: Secondary | ICD-10-CM | POA: Diagnosis not present

## 2020-01-18 DIAGNOSIS — Z20822 Contact with and (suspected) exposure to covid-19: Secondary | ICD-10-CM | POA: Diagnosis not present

## 2020-01-18 DIAGNOSIS — G4733 Obstructive sleep apnea (adult) (pediatric): Secondary | ICD-10-CM | POA: Diagnosis present

## 2020-01-18 DIAGNOSIS — Z79899 Other long term (current) drug therapy: Secondary | ICD-10-CM | POA: Insufficient documentation

## 2020-01-18 DIAGNOSIS — Z6838 Body mass index (BMI) 38.0-38.9, adult: Secondary | ICD-10-CM | POA: Insufficient documentation

## 2020-01-18 DIAGNOSIS — I10 Essential (primary) hypertension: Secondary | ICD-10-CM | POA: Diagnosis not present

## 2020-01-18 DIAGNOSIS — Z7982 Long term (current) use of aspirin: Secondary | ICD-10-CM | POA: Insufficient documentation

## 2020-01-18 DIAGNOSIS — K633 Ulcer of intestine: Secondary | ICD-10-CM | POA: Diagnosis not present

## 2020-01-18 DIAGNOSIS — K625 Hemorrhage of anus and rectum: Secondary | ICD-10-CM | POA: Diagnosis not present

## 2020-01-18 DIAGNOSIS — D122 Benign neoplasm of ascending colon: Secondary | ICD-10-CM | POA: Diagnosis not present

## 2020-01-18 DIAGNOSIS — Z85038 Personal history of other malignant neoplasm of large intestine: Secondary | ICD-10-CM | POA: Diagnosis not present

## 2020-01-18 DIAGNOSIS — K641 Second degree hemorrhoids: Secondary | ICD-10-CM | POA: Insufficient documentation

## 2020-01-18 DIAGNOSIS — K644 Residual hemorrhoidal skin tags: Secondary | ICD-10-CM | POA: Diagnosis not present

## 2020-01-18 DIAGNOSIS — K921 Melena: Secondary | ICD-10-CM | POA: Diagnosis not present

## 2020-01-18 DIAGNOSIS — E669 Obesity, unspecified: Secondary | ICD-10-CM | POA: Diagnosis not present

## 2020-01-18 DIAGNOSIS — K91840 Postprocedural hemorrhage and hematoma of a digestive system organ or structure following a digestive system procedure: Secondary | ICD-10-CM

## 2020-01-18 DIAGNOSIS — J449 Chronic obstructive pulmonary disease, unspecified: Secondary | ICD-10-CM | POA: Insufficient documentation

## 2020-01-18 DIAGNOSIS — K5730 Diverticulosis of large intestine without perforation or abscess without bleeding: Secondary | ICD-10-CM | POA: Diagnosis not present

## 2020-01-18 DIAGNOSIS — Z9842 Cataract extraction status, left eye: Secondary | ICD-10-CM | POA: Insufficient documentation

## 2020-01-18 DIAGNOSIS — I272 Pulmonary hypertension, unspecified: Secondary | ICD-10-CM | POA: Diagnosis present

## 2020-01-18 DIAGNOSIS — K219 Gastro-esophageal reflux disease without esophagitis: Secondary | ICD-10-CM | POA: Insufficient documentation

## 2020-01-18 DIAGNOSIS — R1011 Right upper quadrant pain: Secondary | ICD-10-CM | POA: Insufficient documentation

## 2020-01-18 DIAGNOSIS — K922 Gastrointestinal hemorrhage, unspecified: Secondary | ICD-10-CM

## 2020-01-18 DIAGNOSIS — D649 Anemia, unspecified: Secondary | ICD-10-CM | POA: Diagnosis not present

## 2020-01-18 DIAGNOSIS — Z79890 Hormone replacement therapy (postmenopausal): Secondary | ICD-10-CM | POA: Insufficient documentation

## 2020-01-18 DIAGNOSIS — E038 Other specified hypothyroidism: Secondary | ICD-10-CM | POA: Diagnosis not present

## 2020-01-18 DIAGNOSIS — D62 Acute posthemorrhagic anemia: Secondary | ICD-10-CM

## 2020-01-18 DIAGNOSIS — K5790 Diverticulosis of intestine, part unspecified, without perforation or abscess without bleeding: Secondary | ICD-10-CM

## 2020-01-18 DIAGNOSIS — C180 Malignant neoplasm of cecum: Secondary | ICD-10-CM | POA: Diagnosis present

## 2020-01-18 DIAGNOSIS — E876 Hypokalemia: Secondary | ICD-10-CM | POA: Diagnosis not present

## 2020-01-18 DIAGNOSIS — Z888 Allergy status to other drugs, medicaments and biological substances status: Secondary | ICD-10-CM | POA: Insufficient documentation

## 2020-01-18 DIAGNOSIS — Z9841 Cataract extraction status, right eye: Secondary | ICD-10-CM | POA: Insufficient documentation

## 2020-01-18 DIAGNOSIS — Z8249 Family history of ischemic heart disease and other diseases of the circulatory system: Secondary | ICD-10-CM | POA: Insufficient documentation

## 2020-01-18 DIAGNOSIS — Z885 Allergy status to narcotic agent status: Secondary | ICD-10-CM | POA: Diagnosis not present

## 2020-01-18 DIAGNOSIS — G20 Parkinson's disease: Secondary | ICD-10-CM | POA: Diagnosis not present

## 2020-01-18 DIAGNOSIS — R5383 Other fatigue: Secondary | ICD-10-CM | POA: Insufficient documentation

## 2020-01-18 LAB — COMPREHENSIVE METABOLIC PANEL
ALT: 14 U/L (ref 0–44)
AST: 14 U/L — ABNORMAL LOW (ref 15–41)
Albumin: 3.4 g/dL — ABNORMAL LOW (ref 3.5–5.0)
Alkaline Phosphatase: 93 U/L (ref 38–126)
Anion gap: 10 (ref 5–15)
BUN: 30 mg/dL — ABNORMAL HIGH (ref 8–23)
CO2: 26 mmol/L (ref 22–32)
Calcium: 8.8 mg/dL — ABNORMAL LOW (ref 8.9–10.3)
Chloride: 107 mmol/L (ref 98–111)
Creatinine, Ser: 1.29 mg/dL — ABNORMAL HIGH (ref 0.44–1.00)
GFR calc Af Amer: 45 mL/min — ABNORMAL LOW (ref >60)
GFR calc non Af Amer: 39 mL/min — ABNORMAL LOW (ref >60)
Glucose, Bld: 129 mg/dL — ABNORMAL HIGH (ref 70–99)
Potassium: 3.2 mmol/L — ABNORMAL LOW (ref 3.5–5.1)
Sodium: 143 mmol/L (ref 135–145)
Total Bilirubin: 0.9 mg/dL (ref 0.3–1.2)
Total Protein: 6.2 g/dL — ABNORMAL LOW (ref 6.5–8.1)

## 2020-01-18 LAB — URINALYSIS, ROUTINE W REFLEX MICROSCOPIC
Bilirubin Urine: NEGATIVE
Glucose, UA: NEGATIVE mg/dL
Hgb urine dipstick: NEGATIVE
Ketones, ur: NEGATIVE mg/dL
Leukocytes,Ua: NEGATIVE
Nitrite: NEGATIVE
Protein, ur: NEGATIVE mg/dL
Specific Gravity, Urine: 1.013 (ref 1.005–1.030)
pH: 5 (ref 5.0–8.0)

## 2020-01-18 LAB — HEMOGLOBIN AND HEMATOCRIT, BLOOD
HCT: 36.5 % (ref 36.0–46.0)
Hemoglobin: 11.7 g/dL — ABNORMAL LOW (ref 12.0–15.0)

## 2020-01-18 LAB — POC OCCULT BLOOD, ED: Fecal Occult Bld: POSITIVE — AB

## 2020-01-18 LAB — CBC
HCT: 37.3 % (ref 36.0–46.0)
Hemoglobin: 11.7 g/dL — ABNORMAL LOW (ref 12.0–15.0)
MCH: 30.2 pg (ref 26.0–34.0)
MCHC: 31.4 g/dL (ref 30.0–36.0)
MCV: 96.1 fL (ref 80.0–100.0)
Platelets: 279 10*3/uL (ref 150–400)
RBC: 3.88 MIL/uL (ref 3.87–5.11)
RDW: 14.1 % (ref 11.5–15.5)
WBC: 6.1 10*3/uL (ref 4.0–10.5)
nRBC: 0 % (ref 0.0–0.2)

## 2020-01-18 LAB — TYPE AND SCREEN
ABO/RH(D): O POS
Antibody Screen: NEGATIVE

## 2020-01-18 LAB — SARS CORONAVIRUS 2 BY RT PCR (HOSPITAL ORDER, PERFORMED IN ~~LOC~~ HOSPITAL LAB): SARS Coronavirus 2: NEGATIVE

## 2020-01-18 LAB — LACTIC ACID, PLASMA: Lactic Acid, Venous: 1.2 mmol/L (ref 0.5–1.9)

## 2020-01-18 MED ORDER — TORSEMIDE 20 MG PO TABS
20.0000 mg | ORAL_TABLET | Freq: Every day | ORAL | Status: DC
  Administered 2020-01-19: 20 mg via ORAL
  Filled 2020-01-18: qty 1

## 2020-01-18 MED ORDER — PEG-KCL-NACL-NASULF-NA ASC-C 100 G PO SOLR
0.5000 | Freq: Once | ORAL | Status: AC
  Administered 2020-01-18: 100 g via ORAL

## 2020-01-18 MED ORDER — PEG-KCL-NACL-NASULF-NA ASC-C 100 G PO SOLR: 1.0000 | Freq: Once | ORAL | Status: DC

## 2020-01-18 MED ORDER — AMLODIPINE BESYLATE 10 MG PO TABS
10.0000 mg | ORAL_TABLET | Freq: Every day | ORAL | Status: DC
  Administered 2020-01-19: 10 mg via ORAL
  Filled 2020-01-18: qty 1

## 2020-01-18 MED ORDER — ACETAMINOPHEN 325 MG PO TABS: 650.0000 mg | ORAL_TABLET | Freq: Four times a day (QID) | ORAL | Status: DC | PRN

## 2020-01-18 MED ORDER — CARBIDOPA-LEVODOPA 25-100 MG PO TABS
1.0000 | ORAL_TABLET | Freq: Two times a day (BID) | ORAL | Status: DC
  Administered 2020-01-18: 1 via ORAL
  Filled 2020-01-18: qty 1

## 2020-01-18 MED ORDER — ONDANSETRON HCL 4 MG/2ML IJ SOLN
4.0000 mg | Freq: Four times a day (QID) | INTRAMUSCULAR | Status: DC | PRN
  Administered 2020-01-18: 4 mg via INTRAVENOUS
  Filled 2020-01-18: qty 2

## 2020-01-18 MED ORDER — POTASSIUM CHLORIDE CRYS ER 20 MEQ PO TBCR
40.0000 meq | EXTENDED_RELEASE_TABLET | Freq: Once | ORAL | Status: AC
  Administered 2020-01-18: 40 meq via ORAL
  Filled 2020-01-18: qty 2

## 2020-01-18 MED ORDER — ATENOLOL 50 MG PO TABS
50.0000 mg | ORAL_TABLET | Freq: Every day | ORAL | Status: DC
  Administered 2020-01-19: 50 mg via ORAL
  Filled 2020-01-18: qty 1

## 2020-01-18 MED ORDER — PEG-KCL-NACL-NASULF-NA ASC-C 100 G PO SOLR
0.5000 | Freq: Once | ORAL | Status: AC
  Administered 2020-01-18: 100 g via ORAL
  Filled 2020-01-18: qty 1

## 2020-01-18 MED ORDER — LEVOTHYROXINE SODIUM 50 MCG PO TABS: 62.5000 ug | ORAL_TABLET | Freq: Every day | ORAL | Status: DC

## 2020-01-18 MED ORDER — IRBESARTAN 150 MG PO TABS
150.0000 mg | ORAL_TABLET | Freq: Every day | ORAL | Status: DC
  Administered 2020-01-19: 150 mg via ORAL
  Filled 2020-01-18: qty 1

## 2020-01-18 MED ORDER — METOCLOPRAMIDE HCL 5 MG/ML IJ SOLN
10.0000 mg | Freq: Two times a day (BID) | INTRAMUSCULAR | Status: AC
  Administered 2020-01-18 (×2): 10 mg via INTRAVENOUS
  Filled 2020-01-18 (×2): qty 2

## 2020-01-18 MED ORDER — AMANTADINE HCL 100 MG PO CAPS
100.0000 mg | ORAL_CAPSULE | Freq: Two times a day (BID) | ORAL | Status: DC
  Administered 2020-01-18: 100 mg via ORAL
  Filled 2020-01-18 (×3): qty 1

## 2020-01-18 MED ORDER — BOOST / RESOURCE BREEZE PO LIQD CUSTOM: 1.0000 | Freq: Three times a day (TID) | ORAL | Status: DC

## 2020-01-18 MED ORDER — ACETAMINOPHEN 650 MG RE SUPP: 650.0000 mg | Freq: Four times a day (QID) | RECTAL | Status: DC | PRN

## 2020-01-18 MED ORDER — ONDANSETRON HCL 4 MG PO TABS: 4.0000 mg | ORAL_TABLET | Freq: Four times a day (QID) | ORAL | Status: DC | PRN

## 2020-01-18 NOTE — Progress Notes (Signed)
Poke to patient and patients RN, Ailene Ravel, regarding use of CPAP.  Patient states that she does not really use at home.  Patient is doing a bowel prep this PM and will not be able to utilize CPAP second to being up and down most of evening.  Will be available if prep if not done.  No machine left in room.

## 2020-01-18 NOTE — ED Provider Notes (Addendum)
[facility] DEPT Provider Note   CSN: [id number] Arrival date & time: 01/18/20  1104     History Chief Complaint  Patient presents with  . Blood In Stools    Jacqueline Gamble is a 79 y.o. female.  HPI    79 year old female comes in a chief complaint of blood in the stool. Patient has history of colon cancer, constipation and had a colonoscopy done recently.  He reports that she did not have bowel movements for several days after the colonoscopy and started having the BM 3 days ago.  Ever since then her BM have been bloody.  Patient has noted dark stools and bright red blood per rectum with clots.  She feels little tired but denies any dizziness or lightheadedness.  She is also complaining of abdominal pain that is generalized but worse in the right lower quadrant.  Patient takes aspirin.  She is not on any blood thinners.  Past Medical History:  Diagnosis Date  . Cancer ([place])    colon  . Cataract    removed both eyes  . Colon polyp 2013   TUBULAR ADENOMA  . Common bile duct stone   . Constipation   . Family history of adverse reaction to anesthesia     " It takes a long time for my mother and brother to wake up."  . GERD (gastroesophageal reflux disease)   . Gout   . History of colon cancer 1980  . Hyperlipemia   . Hypertension   . Hypothyroidism   . Pneumonia   . Thyroid disease     Patient Active Problem List   Diagnosis Date Noted  . Acute lower UTI 08/19/2019  . Acute urinary retention 08/19/2019  . AKI (acute kidney injury) ([place]) 08/19/2019  . UTI (urinary tract infection) 08/19/2019  . Other specified hypothyroidism 08/07/2019  . Diet-controlled diabetes mellitus ([place]) 11/30/2017  . 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 Choledocholithiasis   . Common bile duct calculi   . Elevated LFTs   . Fasting hyperglycemia 07/18/2016  . GI bleed 10/29/2015  . Gout 11/13/2013  . Abdominal pain, unspecified site 11/12/2013  . Personal  history of colonic polyps 11/11/2013  . Acute cholecystitis with chronic cholecystitis 11/11/2013  . Obesity (BMI 30-39.9) 11/11/2013  . History of colon cancer   . GERD (gastroesophageal reflux disease)   . Hypertension   . Diverticulosis large intestine w/o perforation or abscess w/o bleeding 11/04/2013  . Skin cancer 12/16/2010  . Acute bronchitis 08/20/2008  . Hematuria 10/29/2007  . Other long term (current) drug therapy 08/29/2007  . Thoracic or lumbosacral neuritis or radiculitis, unspecified 03/05/2007  . Chronic obstructive airway disease with asthma ([place]) 01/24/2007  . Obstructive sleep apnea 01/24/2007  . Pulmonary hypertension ([place]) 11/10/2006  . Bacterial pneumonia 08/30/2004  . Gout, arthritis 05/13/2004  . Hypercholesterolemia 03/19/2003  . Stress fracture of metatarsal bone 04/16/2001  . Absolute anemia 02/12/2001  . Edema 11/14/2000  . Idiopathic peripheral autonomic neuropathy 06/26/2000  . 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 ABDOMINAL HYSTERECTOMY    . APPENDECTOMY    . CHOLECYSTECTOMY N/A 11/12/2013   Procedure: LAPAROSCOPIC CHOLECYSTECTOMY WITH INTRAOPERATIVE CHOLANGIOGRAM;  Surgeon: Merrie Roof, MD;  Location: [facility] ORS;  Service: General;  Laterality: N/A;  . COLON SURGERY      2 times/1980 and 1983  . COLONOSCOPY  2013  . COLONOSCOPY  01/08/2020   2019-TA  .  ERCP N/A 07/28/2016   Procedure: ENDOSCOPIC RETROGRADE CHOLANGIOPANCREATOGRAPHY (ERCP);  Surgeon: Ladene Artist, MD;  Location: [facility] ENDOSCOPY;  Service: Endoscopy;  Laterality: N/A;  . POLYPECTOMY    . TOTAL VAGINAL HYSTERECTOMY       OB History   No obstetric history on file.     Family History  Problem Relation Age of Onset  . Heart disease Mother   . Colon cancer Maternal Grandfather   . Colon polyps Son   . Stomach cancer Brother   . Breast cancer Sister   . Esophageal cancer Neg Hx   . Rectal cancer Neg Hx      Social History   Tobacco Use  . Smoking status: Never Smoker  . Smokeless tobacco: Never Used  Substance Use Topics  . Alcohol use: No  . Drug use: No    Home Medications Prior to Admission medications   Medication Sig Start Date End Date Taking? Authorizing Provider  albuterol (VENTOLIN HFA) 108 (90 Base) MCG/ACT inhaler Inhale 2 puffs into the lungs every 6 (six) hours as needed for wheezing or shortness of breath. 08/23/19  Yes Shelly Coss, MD  Amantadine HCl 100 MG tablet Take 100 mg by mouth 2 (two) times daily. 11/12/19  Yes [provider]  amLODipine (NORVASC) 10 MG tablet Take 1 tablet (10 mg total) by mouth daily. 08/23/19 08/22/20 Yes Shelly Coss, MD  aspirin 81 MG tablet Take 81 mg by mouth daily.   Yes [provider]  atenolol (TENORMIN) 50 MG tablet Take 50 mg by mouth daily. 07/07/19  Yes [provider]  carbidopa-levodopa (SINEMET IR) 25-100 MG tablet Take 1 tablet by mouth in the morning and at bedtime.    Yes [provider]  dicyclomine (BENTYL) 10 MG capsule TAKE ONE CAPSULE BY MOUTH THREE TIMES A DAY BEFORE MEALS Patient taking differently: Take 10 mg by mouth 3 (three) times daily as needed for spasms.  01/03/19  Yes Ladene Artist, MD  irbesartan (AVAPRO) 150 MG tablet Take 150 mg by mouth daily.   Yes [provider]  levothyroxine (SYNTHROID, LEVOTHROID) 125 MCG tablet Take 62.5 mcg by mouth daily before breakfast.   Yes [provider]  Multiple Vitamins-Minerals (PRESERVISION AREDS PO) Take 2 capsules by mouth daily.   Yes [provider]  torsemide (DEMADEX) 20 MG tablet Take 20 mg by mouth daily.   Yes [provider]  ondansetron (ZOFRAN) 4 MG tablet Take 1 tablet (4 mg total) by mouth every 6 (six) hours. Patient not taking: Reported on 01/08/2020 04/28/16   Virgel Manifold, MD  pantoprazole (PROTONIX) 40 MG tablet Take 1 tablet (40 mg total) by mouth 2 (two) times daily. Patient  not taking: Reported on 12/02/2019 05/25/16   Ladene Artist, MD    Allergies    Iodine and Codeine  Review of Systems   Review of Systems  Constitutional: Positive for activity change.  Respiratory: Negative for shortness of breath.   Cardiovascular: Negative for chest pain.  Gastrointestinal: Positive for abdominal pain, blood in stool and constipation. Negative for nausea and vomiting.  Allergic/Immunologic: Negative for immunocompromised state.  Hematological: Does not bruise/bleed easily.  All other systems reviewed and are negative.   Physical Exam Updated Vital Signs BP (!) 152/92   Pulse 67   Temp 98.2 F (36.8 C)   Resp 20   Ht 5' (1.524 m)   Wt 89.4 kg   SpO2 91%   BMI 38.47 kg/m   Physical  Exam Vitals and nursing note reviewed.  Constitutional:      Appearance: She is well-developed.  HENT:     Head: Normocephalic and atraumatic.  Cardiovascular:     Rate and Rhythm: Normal rate.  Pulmonary:     Effort: Pulmonary effort is normal.  Abdominal:     General: Bowel sounds are normal.  Musculoskeletal:     Cervical back: Normal range of motion and neck supple.  Skin:    General: Skin is warm and dry.  Neurological:     Mental Status: She is alert and oriented to person, place, and time.     ED Results / Procedures / Treatments   Labs (all labs ordered are listed, but only abnormal results are displayed) Labs Reviewed  COMPREHENSIVE METABOLIC PANEL - Abnormal; Notable for the following components:      Result Value   Potassium 3.2 (*)    Glucose, Bld 129 (*)    BUN 30 (*)    Creatinine, Ser 1.29 (*)    Calcium 8.8 (*)    Total Protein 6.2 (*)    Albumin 3.4 (*)    AST 14 (*)    GFR calc non Af Amer 39 (*)    GFR calc Af Amer 45 (*)    All other components within normal limits  CBC - Abnormal; Notable for the following components:   Hemoglobin 11.7 (*)    All other components within normal limits  POC OCCULT BLOOD, ED - Abnormal; Notable for  the following components:   Fecal Occult Bld POSITIVE (*)    All other components within normal limits  SARS CORONAVIRUS 2 BY RT PCR (HOSPITAL ORDER, [facility])  LACTIC ACID, PLASMA  URINALYSIS, ROUTINE W REFLEX MICROSCOPIC  TYPE AND SCREEN  ABO/RH    EKG None  Radiology No results found.  Colonoscopy from May, 2021  One 28 mm polyp in the transverse colon, removed piecemeal using a hot snare. Resected and retrieved. - Two sessile polyps in the ascending colon, removed by cold forceps. Resected and retrieved. - Tattoos were seen in the transverse colon. The tattoo site appeared abnormal with above 28 mm polyp on proximal end of tattoos. - Moderate diverticulosis in the left colon. - Patent end-to-end colo-colonic anastomosis, characterized by healthy appearing mucosa. - Internal hemorrhoids. - The examination was otherwise normal on direct and retroflexion views.  Procedures Procedures (including critical care time)  Medications Ordered in ED Medications - No data to display  ED Course  I have reviewed the triage vital signs and the nursing notes.  Pertinent labs & imaging results that were available during my care of the patient were reviewed by me and considered in my medical decision making (see chart for details).  Clinical Course as of Jan 18 1428  Sat Jan 18, 2020  1429 Dr. Rush Landmark, GI has seen the patient and recommends admission with clear liquid diet.  They will likely scope the patient tomorrow.   [AN]  4332 I have reassessed the patient.  Her abdominal exam is unchanged.  White count is normal.  Lactate is clear.  At this time I do not think she needs a CT scan of her abdomen.  Stable for admission.   [AN]    Clinical Course User Index [AN] Varney Biles, MD   MDM Rules/Calculators/A&P                      79 year old female comes in a  chief complaint of bloody stools and abdominal discomfort.  She is status post  colonoscopy from earlier this month.  She had multiple polyps removed.  Abdominal exam is revealing no peritoneal findings, she is having some tenderness over the right lower quadrant.  Differential diagnosis includes stercoral colitis, ischemic colitis, diverticular bleed, internal hemorrhoids.   Plan is to get basic labs.  We will reassess her to see if you need to get CT scan.  Final Clinical Impression(s) / ED Diagnoses Final diagnoses:  Rectal bleeding    Rx / DC Orders ED Discharge Orders    None       Varney Biles, MD 01/18/20 [place], Kinser Fellman, MD 01/18/20 1429

## 2020-01-18 NOTE — H&P (View-Only) (Signed)
Consultation  Referring Provider: Dr. Kathrynn Humble     Primary Care Physician:  Galen Manila, MD Primary Gastroenterologist: Dr. Fuller Plan        Reason for Consultation: Rectal bleeding             HPI:   Jacqueline Gamble is a 79 y.o. female with a past medical history as listed below including colon cancer who presented to the ER today with a complaint of blood in stool.    Today, the patient is seen with her son by her bedside.  She tells me that 3 days ago, Wednesday she had 2 bowel movements which were bloody, this was mostly clots at first/gelatinous and then turned to brighter red blood.  She had 2 more the next day and one Friday morning but has had no bowel movement since then.  Tells me that she is really tired and this is new for her over the past few days.      Also reports chronic right upper quadrant pain which is worse when she passes a stool.    Does tell me that she gets nauseous with bowel prep.    Denies fever, chills, weight loss or symptoms that awaken her from sleep.  GI history: 01/08/2020 colonoscopy Dr. Fuller Plan: - One 28 mm polyp in the transverse colon, removed piecemeal using a hot snare. Resected and retrieved. - Two sessile polyps in the ascending colon, removed by cold forceps. Resected and retrieved. - Tattoos were seen in the transverse colon. The tattoo site appeared abnormal with above 28 mm polyp on proximal end of tattoos. - Moderate diverticulosis in the left colon. - Patent end-to-end colo-colonic anastomosis, characterized by healthy appearing mucosa. - Internal hemorrhoids. - The examination was otherwise normal on direct and retroflexion views. -Repeat recommended in 6 months Pathology: Tubular adenoma  08/07/2018 colonoscopy Dr. Fuller Plan - One 12 mm polyp in the transverse colon, removed with a hot snare. Resected and retrieved. - Three tattoos were seen in the transverse colon. A post-polypectomy scar was found on the proximal side at the tattoo  site. - Two 3 to 5 mm, non-bleeding polyps in the ascending colon. Resected and retrieved. - Patent end-to-end colo-colonic anastomosis, characterized by healthy appearing mucosa. - Diverticulosis in the left colon. - Internal hemorrhoids. - The examination was otherwise normal on direct and retroflexion views. -Repeat recommended in a year Pathology: Tubular adenoma  12/05/2017 patient seen in clinic by Dr. Fuller Plan: At that time described a remote history of colon cancer intermittent right lower quadrant pain, at that time status post EMR of a periampullary duodenal adenomatous polyp at [facility] on 09/26/2016-path report showed complete resection; plan: Scheduled for colonoscopy  Past Medical History:  Diagnosis Date  . Cancer ([facility])    colon  . Cataract    removed both eyes  . Colon polyp 2013   TUBULAR ADENOMA  . Common bile duct stone   . Constipation   . Family history of adverse reaction to anesthesia     " It takes a long time for my mother and brother to wake up."  . GERD (gastroesophageal reflux disease)   . Gout   . History of colon cancer 1980  . Hyperlipemia   . Hypertension   . Hypothyroidism   . Pneumonia   . Thyroid disease     Past Surgical History:  Procedure Laterality Date  . ABDOMINAL HYSTERECTOMY    . APPENDECTOMY    . CHOLECYSTECTOMY N/A 11/12/2013  Procedure: LAPAROSCOPIC CHOLECYSTECTOMY WITH INTRAOPERATIVE CHOLANGIOGRAM;  Surgeon: Merrie Roof, MD;  Location: [facility];  Service: General;  Laterality: N/A;  . COLON SURGERY      2 times/1980 and 1983  . COLONOSCOPY  2013  . COLONOSCOPY  01/08/2020   2019-TA  . ERCP N/A 07/28/2016   Procedure: ENDOSCOPIC RETROGRADE CHOLANGIOPANCREATOGRAPHY (ERCP);  Surgeon: Ladene Artist, MD;  Location: [facility] ENDOSCOPY;  Service: Endoscopy;  Laterality: N/A;  . POLYPECTOMY    . TOTAL VAGINAL HYSTERECTOMY      Family History  Problem Relation Age of Onset  . Heart disease Mother   . Colon cancer Maternal Grandfather   .  Colon polyps Son   . Stomach cancer Brother   . Breast cancer Sister   . Esophageal cancer Neg Hx   . Rectal cancer Neg Hx     Social History   Tobacco Use  . Smoking status: Never Smoker  . Smokeless tobacco: Never Used  Substance Use Topics  . Alcohol use: No  . Drug use: No    Prior to Admission medications   Medication Sig Start Date End Date Taking? Authorizing Provider  albuterol (VENTOLIN HFA) 108 (90 Base) MCG/ACT inhaler Inhale 2 puffs into the lungs every 6 (six) hours as needed for wheezing or shortness of breath. 08/23/19  Yes Shelly Coss, MD  Amantadine HCl 100 MG tablet Take 100 mg by mouth 2 (two) times daily. 11/12/19  Yes [provider]  amLODipine (NORVASC) 10 MG tablet Take 1 tablet (10 mg total) by mouth daily. 08/23/19 08/22/20 Yes Shelly Coss, MD  aspirin 81 MG tablet Take 81 mg by mouth daily.   Yes [provider]  atenolol (TENORMIN) 50 MG tablet Take 50 mg by mouth daily. 07/07/19  Yes [provider]  carbidopa-levodopa (SINEMET IR) 25-100 MG tablet Take 1 tablet by mouth in the morning and at bedtime.    Yes [provider]  dicyclomine (BENTYL) 10 MG capsule TAKE ONE CAPSULE BY MOUTH THREE TIMES A DAY BEFORE MEALS Patient taking differently: Take 10 mg by mouth 3 (three) times daily as needed for spasms.  01/03/19  Yes Ladene Artist, MD  irbesartan (AVAPRO) 150 MG tablet Take 150 mg by mouth daily.   Yes [provider]  levothyroxine (SYNTHROID, LEVOTHROID) 125 MCG tablet Take 62.5 mcg by mouth daily before breakfast.   Yes [provider]  Multiple Vitamins-Minerals (PRESERVISION AREDS PO) Take 2 capsules by mouth daily.   Yes [provider]  torsemide (DEMADEX) 20 MG tablet Take 20 mg by mouth daily.   Yes [provider]  ondansetron (ZOFRAN) 4 MG tablet Take 1 tablet (4 mg total) by mouth every 6 (six) hours. Patient not taking: Reported on 01/08/2020 04/28/16   Virgel Manifold,  MD  pantoprazole (PROTONIX) 40 MG tablet Take 1 tablet (40 mg total) by mouth 2 (two) times daily. Patient not taking: Reported on 12/02/2019 05/25/16   Ladene Artist, MD    Current Facility-Administered Medications  Medication Dose Route Frequency Provider Last Rate Last Admin  . 0.9 %  sodium chloride infusion  500 mL Intravenous Once Ladene Artist, MD       Current Outpatient Medications  Medication Sig Dispense Refill  . albuterol (VENTOLIN HFA) 108 (90 Base) MCG/ACT inhaler Inhale 2 puffs into the lungs every 6 (six) hours as needed for wheezing or shortness of breath. 8 g 1  . Amantadine HCl 100 MG tablet Take 100 mg  by mouth 2 (two) times daily.    Marland Kitchen amLODipine (NORVASC) 10 MG tablet Take 1 tablet (10 mg total) by mouth daily. 30 tablet 1  . aspirin 81 MG tablet Take 81 mg by mouth daily.    Marland Kitchen atenolol (TENORMIN) 50 MG tablet Take 50 mg by mouth daily.    . carbidopa-levodopa (SINEMET IR) 25-100 MG tablet Take 1 tablet by mouth in the morning and at bedtime.     . dicyclomine (BENTYL) 10 MG capsule TAKE ONE CAPSULE BY MOUTH THREE TIMES A DAY BEFORE MEALS (Patient taking differently: Take 10 mg by mouth 3 (three) times daily as needed for spasms. ) 90 capsule 5  . irbesartan (AVAPRO) 150 MG tablet Take 150 mg by mouth daily.    Marland Kitchen levothyroxine (SYNTHROID, LEVOTHROID) 125 MCG tablet Take 62.5 mcg by mouth daily before breakfast.    . Multiple Vitamins-Minerals (PRESERVISION AREDS PO) Take 2 capsules by mouth daily.    Marland Kitchen torsemide (DEMADEX) 20 MG tablet Take 20 mg by mouth daily.    . ondansetron (ZOFRAN) 4 MG tablet Take 1 tablet (4 mg total) by mouth every 6 (six) hours. (Patient not taking: Reported on 01/08/2020) 12 tablet 0  . pantoprazole (PROTONIX) 40 MG tablet Take 1 tablet (40 mg total) by mouth 2 (two) times daily. (Patient not taking: Reported on 12/02/2019) 60 tablet 5    Allergies as of 01/18/2020 - Review Complete 01/18/2020  Allergen Reaction Noted  . Iodine Swelling  06/12/2012  . Codeine Itching and Rash 06/12/2012     Review of Systems:    Constitutional: No weight loss, fever or chills Skin: No rash  Cardiovascular: No chest pain Respiratory: No SOB  Gastrointestinal: See HPI and otherwise negative Genitourinary: No dysuria  Neurological: No headache, dizziness or syncope Musculoskeletal: No new muscle or joint pain Hematologic: No bruising Psychiatric: No history of depression or anxiety    Physical Exam:  Vital signs in last 24 hours: Temp:  [98.2 F (36.8 C)] 98.2 F (36.8 C) (05/29 1117) Pulse Rate:  [65-73] 67 (05/29 1400) Resp:  [16-20] 20 (05/29 1400) BP: (150-189)/(66-94) 152/92 (05/29 1400) SpO2:  [91 %-100 %] 91 % (05/29 1400) Weight:  [89.4 kg] 89.4 kg (05/29 1127)   General:   Pleasant Elderly Caucasian female appears to be in NAD, Well developed, Well nourished, alert and cooperative Head:  Normocephalic and atraumatic. Eyes:   PEERL, EOMI. No icterus. Conjunctiva pink. Ears:  Normal auditory acuity. Neck:  Supple Throat: Oral cavity and pharynx without inflammation, swelling or lesion. Teeth in good condition. Lungs: Respirations even and unlabored. Lungs clear to auscultation bilaterally.   No wheezes, crackles, or rhonchi.  Heart: Normal S1, S2. No MRG. Regular rate and rhythm. No peripheral edema, cyanosis or pallor.  Abdomen:  Soft, nondistended,moderate RUQ ttp No rebound or guarding. Normal bowel sounds. No appreciable masses or hepatomegaly. Rectal:  Not performed.  Msk:  Symmetrical without gross deformities. Peripheral pulses intact.  Extremities:  Without edema, no deformity or joint abnormality.  Neurologic:  Alert and  oriented x4;  grossly normal neurologically.  Skin:   Dry and intact without significant lesions or rashes. Psychiatric: Demonstrates good judgement and reason without abnormal affect or behaviors.   LAB RESULTS: Recent Labs    01/18/20 1137  WBC 6.1  HGB 11.7*  HCT 37.3  PLT 279    BMET Recent Labs    01/18/20 1137  NA 143  K 3.2*  CL 107  CO2 26  GLUCOSE 129*  BUN 30*  CREATININE 1.29*  CALCIUM 8.8*   LFT Recent Labs    01/18/20 1137  PROT 6.2*  ALBUMIN 3.4*  AST 14*  ALT 14  ALKPHOS 93  BILITOT 0.9     Impression / Plan:   Impression: 1.  GI bleed: Recent colonoscopy 01/08/2020 with piecemeal removal of a large polyp, 4 episodes of bright red blood/clots, the last 01/17/2019 1 AM; concern for post polypectomy bleed 2.  Chronic right upper quadrant abdominal pain: No change recently  Plan: 1.  Colonoscopy scheduled tomorrow 01/19/2020 with Dr. Rush Landmark.  Did discuss risks, benefits, limitations and alternatives and patient agrees to proceed. 2.  Covid testing is already ordered 3.  Patient will start movi prep around 4:00 this evening and a split dose fashion and n.p.o. after midnight. 4.  Patient be on clears until midnight. 5.  Bedside commode ordered. 6.  Ordered Reglan 10 mg to be given IV prior to both doses of prep. 7.  Continue to monitor hemoglobin with transfusion as needed less than 7.  Ordered hemoglobin every 12 H 8.  Please await further recommendations from Dr. Rush Landmark later today.  Thank you for your kind consultation, we will continue to follow.  Jacqueline Gamble  01/18/2020, 2:24 PM

## 2020-01-18 NOTE — H&P (Signed)
History and Physical  Patient Name: Jacqueline Gamble     YOV:[id number]    DOB: [birth year]    DOA: 01/18/2020 PCP: Galen Manila, MD  Patient coming from: Home  Chief Complaint: Active bleeding      HPI: Jacqueline Gamble is a 79 y.o. at with hx Parkinson's, hypertension, hypothyroidism, remote colon cancer who presents with rectal bleeding.  The patient was in her usual state of health until a few days ago she had a colonoscopy.  Subsequently she developed bright red blood per rectum with clots.  Me this was just today, although from GI notes it appears that she had several in the last several days, and is now fatigued.  Also associated with some cramping in the right lower quadrant.  No fever, chills.  Has eaten out this week.  In the ER, heart rate normal, blood pressure elevated.  Her hemoglobin was 11 which is stable relative to her baseline.  On rectal exam she had red blood.  Case was discussed with GI, who recommended observation overnight.         ROS: Review of Systems  Constitutional: Positive for malaise/fatigue. Negative for chills and fever.  Gastrointestinal: Positive for blood in stool.  All other systems reviewed and are negative.         Past Medical History:  Diagnosis Date  . Cancer ([place])    colon  . Cataract    removed both eyes  . Colon polyp 2013   TUBULAR ADENOMA  . Common bile duct stone   . Constipation   . Family history of adverse reaction to anesthesia     " It takes a long time for my mother and brother to wake up."  . GERD (gastroesophageal reflux disease)   . Gout   . History of colon cancer 1980  . Hyperlipemia   . Hypertension   . Hypothyroidism   . Pneumonia   . Thyroid disease     Past Surgical History:  Procedure Laterality Date  . ABDOMINAL HYSTERECTOMY    . APPENDECTOMY    . CHOLECYSTECTOMY N/A 11/12/2013   Procedure: LAPAROSCOPIC CHOLECYSTECTOMY WITH INTRAOPERATIVE CHOLANGIOGRAM;  Surgeon: Merrie Roof, MD;  Location:  [facility] ORS;  Service: General;  Laterality: N/A;  . COLON SURGERY      2 times/1980 and 1983  . COLONOSCOPY  2013  . COLONOSCOPY  01/08/2020   2019-TA  . ERCP N/A 07/28/2016   Procedure: ENDOSCOPIC RETROGRADE CHOLANGIOPANCREATOGRAPHY (ERCP);  Surgeon: Ladene Artist, MD;  Location: [facility] ENDOSCOPY;  Service: Endoscopy;  Laterality: N/A;  . POLYPECTOMY    . TOTAL VAGINAL HYSTERECTOMY      Social History: Patient lives with her husband.  The patient walks assisted.  never smoker.  Allergies  Allergen Reactions  . Iodine Swelling  . Codeine Itching and Rash    Family history: family history includes Breast cancer in her sister; Colon cancer in her maternal grandfather; Colon polyps in her son; Heart disease in her mother; Stomach cancer in her brother.  Prior to Admission medications   Medication Sig Start Date End Date Taking? Authorizing Provider  albuterol (VENTOLIN HFA) 108 (90 Base) MCG/ACT inhaler Inhale 2 puffs into the lungs every 6 (six) hours as needed for wheezing or shortness of breath. 08/23/19  Yes Shelly Coss, MD  Amantadine HCl 100 MG tablet Take 100 mg by mouth 2 (two) times daily. 11/12/19  Yes [provider]  amLODipine (NORVASC) 10 MG tablet Take 1 tablet (  10 mg total) by mouth daily. 08/23/19 08/22/20 Yes Shelly Coss, MD  aspirin 81 MG tablet Take 81 mg by mouth daily.   Yes [provider]  atenolol (TENORMIN) 50 MG tablet Take 50 mg by mouth daily. 07/07/19  Yes [provider]  carbidopa-levodopa (SINEMET IR) 25-100 MG tablet Take 1 tablet by mouth in the morning and at bedtime.    Yes [provider]  dicyclomine (BENTYL) 10 MG capsule TAKE ONE CAPSULE BY MOUTH THREE TIMES A DAY BEFORE MEALS Patient taking differently: Take 10 mg by mouth 3 (three) times daily as needed for spasms.  01/03/19  Yes Ladene Artist, MD  irbesartan (AVAPRO) 150 MG tablet Take 150 mg by mouth daily.   Yes [provider]  levothyroxine  (SYNTHROID, LEVOTHROID) 125 MCG tablet Take 62.5 mcg by mouth daily before breakfast.   Yes [provider]  Multiple Vitamins-Minerals (PRESERVISION AREDS PO) Take 2 capsules by mouth daily.   Yes [provider]  torsemide (DEMADEX) 20 MG tablet Take 20 mg by mouth daily.   Yes [provider]  ondansetron (ZOFRAN) 4 MG tablet Take 1 tablet (4 mg total) by mouth every 6 (six) hours. Patient not taking: Reported on 01/08/2020 04/28/16   Virgel Manifold, MD  pantoprazole (PROTONIX) 40 MG tablet Take 1 tablet (40 mg total) by mouth 2 (two) times daily. Patient not taking: Reported on 12/02/2019 05/25/16   Ladene Artist, MD       Physical Exam: BP (!) 182/72 (BP Location: Right Arm)   Pulse 60   Temp 97.7 F (36.5 C) (Oral)   Resp 18   Ht 5' (1.524 m)   Wt 89.4 kg   SpO2 100%   BMI 38.47 kg/m  General appearance: Well-developed, adult female, alert and in no acute distress .   Eyes: Anicteric, conjunctiva pink, lids and lashes normal. PERRL.    ENT: No nasal deformity, discharge, epistaxis.  Hearing normal. OP moist without lesions.   Neck: No neck masses.  Trachea midline.  No thyromegaly/tenderness. Lymph: No cervical or supraclavicular lymphadenopathy. Skin: Warm and dry.  No jaundice.  No suspicious rashes or lesions. Cardiac: RRR, nl S1-S2, no murmurs appreciated.  Capillary refill is brisk.  JVP not visible.  No LE edema.  Radial pulses 2+ and symmetric. Respiratory: Normal respiratory rate and rhythm.  CTAB without rales or wheezes. Abdomen: Abdomen soft.  Moderate right lower quadrant TTP with voluntary guarding. No ascites, distension, hepatosplenomegaly.   MSK: No deformities or effusions of the large joints of the upper or lower extremities bilaterally.  No cyanosis or clubbing. Neuro: Cranial nerves 3 through 12 intact.  Sensation intact to light touch. Speech is fluent.  Muscle strength normal.    Psych: Sensorium intact and responding to  questions, attention normal.  Behavior appropriate.  Affect normal.  Judgment and insight appear normal.     Labs on Admission:  I have personally reviewed following labs and imaging studies: CBC: Recent Labs  Lab 01/18/20 1137  WBC 6.1  HGB 11.7*  HCT 37.3  MCV 96.1  PLT 403   Basic Metabolic Panel: Recent Labs  Lab 01/18/20 1137  NA 143  K 3.2*  CL 107  CO2 26  GLUCOSE 129*  BUN 30*  CREATININE 1.29*  CALCIUM 8.8*   GFR: Estimated Creatinine Clearance: 34.6 mL/min (A) (by C-G formula based on SCr of 1.29 mg/dL (H)).  Liver Function Tests: Recent Labs  Lab 01/18/20 1137  AST 14*  ALT 14  ALKPHOS 93  BILITOT 0.9  PROT 6.2*  ALBUMIN 3.4*     Recent Results (from the past 240 hour(s))  SARS Coronavirus 2 by RT PCR (hospital order, performed in [facility] hospital lab) Nasopharyngeal Nasopharyngeal Swab     Status: None   Collection Time: 01/18/20  1:46 PM   Specimen: Nasopharyngeal Swab  Result Value Ref Range Status   SARS Coronavirus 2 NEGATIVE NEGATIVE Final    Comment: (NOTE) SARS-CoV-2 target nucleic acids are NOT DETECTED. The SARS-CoV-2 RNA is generally detectable in upper and lower respiratory specimens during the acute phase of infection. The lowest concentration of SARS-CoV-2 viral copies this assay can detect is 250 copies / mL. A negative result does not preclude SARS-CoV-2 infection and should not be used as the sole basis for treatment or other patient management decisions.  A negative result may occur with improper specimen collection / handling, submission of specimen other than nasopharyngeal swab, presence of viral mutation(s) within the areas targeted by this assay, and inadequate number of viral copies (<250 copies / mL). A negative result must be combined with clinical observations, patient history, and epidemiological information. Fact Sheet for Patients:   StrictlyIdeas.no Fact Sheet for Healthcare  Providers: BankingDealers.co.za This test is not yet approved or cleared  by the [place] FDA and has been authorized for detection and/or diagnosis of SARS-CoV-2 by FDA under an Emergency Use Authorization (EUA).  This EUA will remain in effect (meaning this test can be used) for the duration of the COVID-19 declaration under Section 564(b)(1) of the Act, 21 U.S.C. section 360bbb-3(b)(1), unless the authorization is terminated or revoked sooner. Performed at [facility], [place] [address], [place], [place] [address]               Assessment/Plan   Rectal bleeding Suspect post polypectomy bleeding versus ischemic colitis.  Less likely infectious or diverticular bleeding. -Clears only -Start bowel prep -Consult GI, appreciate recommendations -Trend hemoglobin overnight -Hold aspirin     Hypertension Pressure elevated initially -Continue amlodipine, atenolol, irbesartan, torsemide  Hypothyroidism -Continue levothyroxine  Parkinson's disease -Continue Sinemet, amantadine  Hypokalemia -Supp K         DVT prophylaxis: SCDs  Code Status: FULL  Family Communication:   Disposition Plan: Anticipate obs overnight and endoscopy tomorrow Consults called: GI Admission status: OBS   At the point of initial evaluation, it is my clinical opinion that admission for OBSERVATION is reasonable and necessary because the patient's presenting complaints in the context of their chronic conditions represent sufficient risk of deterioration or significant morbidity to constitute reasonable grounds for close observation in the hospital setting, but that the patient may be medically stable for discharge from the hospital within 24 to 48 hours.    Medical decision making: Patient seen at 4:44 PM on 01/18/2020.  The patient was discussed with Dr. Kathrynn Humble.  What exists of the patient's chart was reviewed in depth and summarized above.   Clinical condition: stable.        [facility] Please page though [place] or Epic secure chat:  For password, contact charge nurse

## 2020-01-18 NOTE — Consult Note (Addendum)
Consultation  Referring Provider: Dr. Kathrynn Humble     Primary Care Physician:  Galen Manila, MD Primary Gastroenterologist: Dr. Fuller Plan        Reason for Consultation: Rectal bleeding             HPI:   Jacqueline Gamble is a 79 y.o. female with a past medical history as listed below including colon cancer who presented to the ER today with a complaint of blood in stool.    Today, the patient is seen with her son by her bedside.  She tells me that 3 days ago, Wednesday she had 2 bowel movements which were bloody, this was mostly clots at first/gelatinous and then turned to brighter red blood.  She had 2 more the next day and one Friday morning but has had no bowel movement since then.  Tells me that she is really tired and this is new for her over the past few days.      Also reports chronic right upper quadrant pain which is worse when she passes a stool.    Does tell me that she gets nauseous with bowel prep.    Denies fever, chills, weight loss or symptoms that awaken her from sleep.  GI history: 01/08/2020 colonoscopy Dr. Fuller Plan: - One 28 mm polyp in the transverse colon, removed piecemeal using a hot snare. Resected and retrieved. - Two sessile polyps in the ascending colon, removed by cold forceps. Resected and retrieved. - Tattoos were seen in the transverse colon. The tattoo site appeared abnormal with above 28 mm polyp on proximal end of tattoos. - Moderate diverticulosis in the left colon. - Patent end-to-end colo-colonic anastomosis, characterized by healthy appearing mucosa. - Internal hemorrhoids. - The examination was otherwise normal on direct and retroflexion views. -Repeat recommended in 6 months Pathology: Tubular adenoma  08/07/2018 colonoscopy Dr. Fuller Plan - One 12 mm polyp in the transverse colon, removed with a hot snare. Resected and retrieved. - Three tattoos were seen in the transverse colon. A post-polypectomy scar was found on the proximal side at the tattoo  site. - Two 3 to 5 mm, non-bleeding polyps in the ascending colon. Resected and retrieved. - Patent end-to-end colo-colonic anastomosis, characterized by healthy appearing mucosa. - Diverticulosis in the left colon. - Internal hemorrhoids. - The examination was otherwise normal on direct and retroflexion views. -Repeat recommended in a year Pathology: Tubular adenoma  12/05/2017 patient seen in clinic by Dr. Fuller Plan: At that time described a remote history of colon cancer intermittent right lower quadrant pain, at that time status post EMR of a periampullary duodenal adenomatous polyp at [facility] on 09/26/2016-path report showed complete resection; plan: Scheduled for colonoscopy  Past Medical History:  Diagnosis Date  . Cancer ([place])    colon  . Cataract    removed both eyes  . Colon polyp 2013   TUBULAR ADENOMA  . Common bile duct stone   . Constipation   . Family history of adverse reaction to anesthesia     " It takes a long time for my mother and brother to wake up."  . GERD (gastroesophageal reflux disease)   . Gout   . History of colon cancer 1980  . Hyperlipemia   . Hypertension   . Hypothyroidism   . Pneumonia   . Thyroid disease     Past Surgical History:  Procedure Laterality Date  . ABDOMINAL HYSTERECTOMY    . APPENDECTOMY    . CHOLECYSTECTOMY N/A 11/12/2013  Procedure: LAPAROSCOPIC CHOLECYSTECTOMY WITH INTRAOPERATIVE CHOLANGIOGRAM;  Surgeon: Merrie Roof, MD;  Location: [facility] ORS;  Service: General;  Laterality: N/A;  . COLON SURGERY      2 times/1980 and 1983  . COLONOSCOPY  2013  . COLONOSCOPY  01/08/2020   2019-TA  . ERCP N/A 07/28/2016   Procedure: ENDOSCOPIC RETROGRADE CHOLANGIOPANCREATOGRAPHY (ERCP);  Surgeon: Ladene Artist, MD;  Location: [facility] ENDOSCOPY;  Service: Endoscopy;  Laterality: N/A;  . POLYPECTOMY    . TOTAL VAGINAL HYSTERECTOMY      Family History  Problem Relation Age of Onset  . Heart disease Mother   . Colon cancer Maternal Grandfather   .  Colon polyps Son   . Stomach cancer Brother   . Breast cancer Sister   . Esophageal cancer Neg Hx   . Rectal cancer Neg Hx     Social History   Tobacco Use  . Smoking status: Never Smoker  . Smokeless tobacco: Never Used  Substance Use Topics  . Alcohol use: No  . Drug use: No    Prior to Admission medications   Medication Sig Start Date End Date Taking? Authorizing Provider  albuterol (VENTOLIN HFA) 108 (90 Base) MCG/ACT inhaler Inhale 2 puffs into the lungs every 6 (six) hours as needed for wheezing or shortness of breath. 08/23/19  Yes Shelly Coss, MD  Amantadine HCl 100 MG tablet Take 100 mg by mouth 2 (two) times daily. 11/12/19  Yes [provider]  amLODipine (NORVASC) 10 MG tablet Take 1 tablet (10 mg total) by mouth daily. 08/23/19 08/22/20 Yes Shelly Coss, MD  aspirin 81 MG tablet Take 81 mg by mouth daily.   Yes [provider]  atenolol (TENORMIN) 50 MG tablet Take 50 mg by mouth daily. 07/07/19  Yes [provider]  carbidopa-levodopa (SINEMET IR) 25-100 MG tablet Take 1 tablet by mouth in the morning and at bedtime.    Yes [provider]  dicyclomine (BENTYL) 10 MG capsule TAKE ONE CAPSULE BY MOUTH THREE TIMES A DAY BEFORE MEALS Patient taking differently: Take 10 mg by mouth 3 (three) times daily as needed for spasms.  01/03/19  Yes Ladene Artist, MD  irbesartan (AVAPRO) 150 MG tablet Take 150 mg by mouth daily.   Yes [provider]  levothyroxine (SYNTHROID, LEVOTHROID) 125 MCG tablet Take 62.5 mcg by mouth daily before breakfast.   Yes [provider]  Multiple Vitamins-Minerals (PRESERVISION AREDS PO) Take 2 capsules by mouth daily.   Yes [provider]  torsemide (DEMADEX) 20 MG tablet Take 20 mg by mouth daily.   Yes [provider]  ondansetron (ZOFRAN) 4 MG tablet Take 1 tablet (4 mg total) by mouth every 6 (six) hours. Patient not taking: Reported on 01/08/2020 04/28/16   Virgel Manifold,  MD  pantoprazole (PROTONIX) 40 MG tablet Take 1 tablet (40 mg total) by mouth 2 (two) times daily. Patient not taking: Reported on 12/02/2019 05/25/16   Ladene Artist, MD    Current Facility-Administered Medications  Medication Dose Route Frequency Provider Last Rate Last Admin  . 0.9 %  sodium chloride infusion  500 mL Intravenous Once Ladene Artist, MD       Current Outpatient Medications  Medication Sig Dispense Refill  . albuterol (VENTOLIN HFA) 108 (90 Base) MCG/ACT inhaler Inhale 2 puffs into the lungs every 6 (six) hours as needed for wheezing or shortness of breath. 8 g 1  . Amantadine HCl 100 MG tablet Take 100 mg  by mouth 2 (two) times daily.    Marland Kitchen amLODipine (NORVASC) 10 MG tablet Take 1 tablet (10 mg total) by mouth daily. 30 tablet 1  . aspirin 81 MG tablet Take 81 mg by mouth daily.    Marland Kitchen atenolol (TENORMIN) 50 MG tablet Take 50 mg by mouth daily.    . carbidopa-levodopa (SINEMET IR) 25-100 MG tablet Take 1 tablet by mouth in the morning and at bedtime.     . dicyclomine (BENTYL) 10 MG capsule TAKE ONE CAPSULE BY MOUTH THREE TIMES A DAY BEFORE MEALS (Patient taking differently: Take 10 mg by mouth 3 (three) times daily as needed for spasms. ) 90 capsule 5  . irbesartan (AVAPRO) 150 MG tablet Take 150 mg by mouth daily.    Marland Kitchen levothyroxine (SYNTHROID, LEVOTHROID) 125 MCG tablet Take 62.5 mcg by mouth daily before breakfast.    . Multiple Vitamins-Minerals (PRESERVISION AREDS PO) Take 2 capsules by mouth daily.    Marland Kitchen torsemide (DEMADEX) 20 MG tablet Take 20 mg by mouth daily.    . ondansetron (ZOFRAN) 4 MG tablet Take 1 tablet (4 mg total) by mouth every 6 (six) hours. (Patient not taking: Reported on 01/08/2020) 12 tablet 0  . pantoprazole (PROTONIX) 40 MG tablet Take 1 tablet (40 mg total) by mouth 2 (two) times daily. (Patient not taking: Reported on 12/02/2019) 60 tablet 5    Allergies as of 01/18/2020 - Review Complete 01/18/2020  Allergen Reaction Noted  . Iodine Swelling  06/12/2012  . Codeine Itching and Rash 06/12/2012     Review of Systems:    Constitutional: No weight loss, fever or chills Skin: No rash  Cardiovascular: No chest pain Respiratory: No SOB  Gastrointestinal: See HPI and otherwise negative Genitourinary: No dysuria  Neurological: No headache, dizziness or syncope Musculoskeletal: No new muscle or joint pain Hematologic: No bruising Psychiatric: No history of depression or anxiety    Physical Exam:  Vital signs in last 24 hours: Temp:  [98.2 F (36.8 C)] 98.2 F (36.8 C) (05/29 1117) Pulse Rate:  [65-73] 67 (05/29 1400) Resp:  [16-20] 20 (05/29 1400) BP: (150-189)/(66-94) 152/92 (05/29 1400) SpO2:  [91 %-100 %] 91 % (05/29 1400) Weight:  [89.4 kg] 89.4 kg (05/29 1127)   General:   Pleasant Elderly Caucasian female appears to be in NAD, Well developed, Well nourished, alert and cooperative Head:  Normocephalic and atraumatic. Eyes:   PEERL, EOMI. No icterus. Conjunctiva pink. Ears:  Normal auditory acuity. Neck:  Supple Throat: Oral cavity and pharynx without inflammation, swelling or lesion. Teeth in good condition. Lungs: Respirations even and unlabored. Lungs clear to auscultation bilaterally.   No wheezes, crackles, or rhonchi.  Heart: Normal S1, S2. No MRG. Regular rate and rhythm. No peripheral edema, cyanosis or pallor.  Abdomen:  Soft, nondistended,moderate RUQ ttp No rebound or guarding. Normal bowel sounds. No appreciable masses or hepatomegaly. Rectal:  Not performed.  Msk:  Symmetrical without gross deformities. Peripheral pulses intact.  Extremities:  Without edema, no deformity or joint abnormality.  Neurologic:  Alert and  oriented x4;  grossly normal neurologically.  Skin:   Dry and intact without significant lesions or rashes. Psychiatric: Demonstrates good judgement and reason without abnormal affect or behaviors.   LAB RESULTS: Recent Labs    01/18/20 1137  WBC 6.1  HGB 11.7*  HCT 37.3  PLT 279    BMET Recent Labs    01/18/20 1137  NA 143  K 3.2*  CL 107  CO2 26  GLUCOSE 129*  BUN 30*  CREATININE 1.29*  CALCIUM 8.8*   LFT Recent Labs    01/18/20 1137  PROT 6.2*  ALBUMIN 3.4*  AST 14*  ALT 14  ALKPHOS 93  BILITOT 0.9     Impression / Plan:   Impression: 1.  GI bleed: Recent colonoscopy 01/08/2020 with piecemeal removal of a large polyp, 4 episodes of bright red blood/clots, the last 01/17/2019 1 AM; concern for post polypectomy bleed 2.  Chronic right upper quadrant abdominal pain: No change recently  Plan: 1.  Colonoscopy scheduled tomorrow 01/19/2020 with Dr. Rush Landmark.  Did discuss risks, benefits, limitations and alternatives and patient agrees to proceed. 2.  Covid testing is already ordered 3.  Patient will start movi prep around 4:00 this evening and a split dose fashion and n.p.o. after midnight. 4.  Patient be on clears until midnight. 5.  Bedside commode ordered. 6.  Ordered Reglan 10 mg to be given IV prior to both doses of prep. 7.  Continue to monitor hemoglobin with transfusion as needed less than 7.  Ordered hemoglobin every 12 H 8.  Please await further recommendations from Dr. Rush Landmark later today.  Thank you for your kind consultation, we will continue to follow.  Lavone Nian Lasean Gorniak  01/18/2020, 2:24 PM

## 2020-01-18 NOTE — ED Triage Notes (Signed)
Per patient, she has blood in stool starting last night. Patient states she had colonoscopy on 5/19.

## 2020-01-19 ENCOUNTER — Observation Stay (HOSPITAL_COMMUNITY): Payer: Medicare Other | Admitting: Anesthesiology

## 2020-01-19 ENCOUNTER — Encounter (HOSPITAL_COMMUNITY)
Admission: EM | Disposition: A | Discharge status: Home / Self Care | Payer: Self-pay | Source: Home / Self Care | Attending: Emergency Medicine

## 2020-01-19 ENCOUNTER — Encounter (HOSPITAL_COMMUNITY): Payer: Self-pay | Admitting: Family Medicine

## 2020-01-19 DIAGNOSIS — K921 Melena: Secondary | ICD-10-CM | POA: Diagnosis not present

## 2020-01-19 DIAGNOSIS — G20 Parkinson's disease: Secondary | ICD-10-CM | POA: Diagnosis not present

## 2020-01-19 DIAGNOSIS — K625 Hemorrhage of anus and rectum: Secondary | ICD-10-CM

## 2020-01-19 DIAGNOSIS — K633 Ulcer of intestine: Secondary | ICD-10-CM

## 2020-01-19 DIAGNOSIS — I10 Essential (primary) hypertension: Secondary | ICD-10-CM | POA: Diagnosis not present

## 2020-01-19 DIAGNOSIS — K635 Polyp of colon: Secondary | ICD-10-CM

## 2020-01-19 DIAGNOSIS — E038 Other specified hypothyroidism: Secondary | ICD-10-CM | POA: Diagnosis not present

## 2020-01-19 HISTORY — PX: COLONOSCOPY WITH PROPOFOL: SHX5780

## 2020-01-19 HISTORY — PX: POLYPECTOMY: SHX5525

## 2020-01-19 HISTORY — PX: HEMOSTASIS CLIP PLACEMENT: SHX6857

## 2020-01-19 LAB — CBC
HCT: 33.6 % — ABNORMAL LOW (ref 36.0–46.0)
Hemoglobin: 10.5 g/dL — ABNORMAL LOW (ref 12.0–15.0)
MCH: 30.2 pg (ref 26.0–34.0)
MCHC: 31.3 g/dL (ref 30.0–36.0)
MCV: 96.6 fL (ref 80.0–100.0)
Platelets: 259 10*3/uL (ref 150–400)
RBC: 3.48 MIL/uL — ABNORMAL LOW (ref 3.87–5.11)
RDW: 14.3 % (ref 11.5–15.5)
WBC: 6.1 10*3/uL (ref 4.0–10.5)
nRBC: 0 % (ref 0.0–0.2)

## 2020-01-19 LAB — BASIC METABOLIC PANEL
Anion gap: 8 (ref 5–15)
BUN: 22 mg/dL (ref 8–23)
CO2: 24 mmol/L (ref 22–32)
Calcium: 8.6 mg/dL — ABNORMAL LOW (ref 8.9–10.3)
Chloride: 111 mmol/L (ref 98–111)
Creatinine, Ser: 1.06 mg/dL — ABNORMAL HIGH (ref 0.44–1.00)
GFR calc Af Amer: 57 mL/min — ABNORMAL LOW (ref >60)
GFR calc non Af Amer: 50 mL/min — ABNORMAL LOW (ref >60)
Glucose, Bld: 96 mg/dL (ref 70–99)
Potassium: 3.4 mmol/L — ABNORMAL LOW (ref 3.5–5.1)
Sodium: 143 mmol/L (ref 135–145)

## 2020-01-19 LAB — ABO/RH: ABO/RH(D): O POS

## 2020-01-19 SURGERY — COLONOSCOPY WITH PROPOFOL
Anesthesia: Monitor Anesthesia Care

## 2020-01-19 MED ORDER — PROPOFOL 10 MG/ML IV BOLUS
INTRAVENOUS | Status: DC | PRN
  Administered 2020-01-19: 10 mg via INTRAVENOUS
  Administered 2020-01-19 (×17): 20 mg via INTRAVENOUS

## 2020-01-19 MED ORDER — OXYCODONE HCL 5 MG PO TABS: 5.0000 mg | ORAL_TABLET | Freq: Once | ORAL | Status: DC | PRN

## 2020-01-19 MED ORDER — POTASSIUM CHLORIDE 10 MEQ/100ML IV SOLN
10.0000 meq | INTRAVENOUS | Status: AC
  Administered 2020-01-19 (×2): 10 meq via INTRAVENOUS
  Filled 2020-01-19 (×2): qty 100

## 2020-01-19 MED ORDER — FENTANYL CITRATE (PF) 100 MCG/2ML IJ SOLN: 25.0000 ug | INTRAMUSCULAR | Status: DC | PRN

## 2020-01-19 MED ORDER — LIDOCAINE 2% (20 MG/ML) 5 ML SYRINGE
INTRAMUSCULAR | Status: DC | PRN
  Administered 2020-01-19: 40 mg via INTRAVENOUS

## 2020-01-19 MED ORDER — ONDANSETRON HCL 4 MG/2ML IJ SOLN: 4.0000 mg | Freq: Once | INTRAMUSCULAR | Status: DC | PRN

## 2020-01-19 MED ORDER — LACTATED RINGERS IV SOLN
INTRAVENOUS | Status: AC | PRN
  Administered 2020-01-19: 25 mL via INTRAVENOUS

## 2020-01-19 MED ORDER — OXYCODONE HCL 5 MG/5ML PO SOLN: 5.0000 mg | Freq: Once | ORAL | Status: DC | PRN

## 2020-01-19 SURGICAL SUPPLY — 22 items

## 2020-01-19 NOTE — Anesthesia Postprocedure Evaluation (Signed)
Anesthesia Post Note  Patient: Jacqueline Gamble  Procedure(s) Performed: COLONOSCOPY WITH PROPOFOL (N/A ) POLYPECTOMY     Patient location during evaluation: PACU Anesthesia Type: MAC Level of consciousness: awake and alert Pain management: pain level controlled Vital Signs Assessment: post-procedure vital signs reviewed and stable Respiratory status: spontaneous breathing, nonlabored ventilation and respiratory function stable Cardiovascular status: blood pressure returned to baseline and stable Postop Assessment: no apparent nausea or vomiting Anesthetic complications: no    Last Vitals:  Vitals:   01/19/20 1343 01/19/20 1345  BP:  (P) 124/73  Pulse: 66   Resp:    Temp:  (!) (P) 36.3 C  SpO2: 100%     Last Pain:  Vitals:   01/19/20 1244  TempSrc:   PainSc: 0-No pain                 Lidia Collum

## 2020-01-19 NOTE — Op Note (Signed)
[facility] Patient Name: Jacqueline Gamble Procedure Date: 01/19/2020 MRN: [id number] Attending MD: Justice Britain , MD Date of Birth: [birth year] CSN: [id number] Age: 79 Admit Type: Inpatient Procedure:                Colonoscopy Indications:              Treatment of bleeding from presumed polypectomy                            site, Hematochezia (noted up until completion of                            her preparation today) Providers:                Justice Britain, MD, Grace Isaac, RN, Corie Chiquito, Technician Referring MD:             [facility], Pricilla Riffle. Fuller Plan, MD Medicines:                Monitored Anesthesia Care Complications:            No immediate complications. Estimated Blood Loss:     Estimated blood loss was minimal. Procedure:                Pre-Anesthesia Assessment:                           - Prior to the procedure, a History and Physical                            was performed, and patient medications and                            allergies were reviewed. The patient's tolerance of                            previous anesthesia was also reviewed. The risks                            and benefits of the procedure and the sedation                            options and risks were discussed with the patient.                            All questions were answered, and informed consent                            was obtained. Prior Anticoagulants: The patient has                            taken no previous anticoagulant or antiplatelet  agents except for aspirin. ASA Grade Assessment:                            III - A patient with severe systemic disease. After                            reviewing the risks and benefits, the patient was                            deemed in satisfactory condition to undergo the                            procedure.                           After  obtaining informed consent, the colonoscope                            was passed under direct vision. Throughout the                            procedure, the patient's blood pressure, pulse, and                            oxygen saturations were monitored continuously. The                            CF-HQ190L ([id number]) Olympus colonoscope was                            introduced through the anus and advanced to the the                            cecum, identified by appendiceal orifice and                            ileocecal valve. The colonoscopy was technically                            difficult and complex due to the patient's position                            intolerance in regards to the scope itself.                            Significant respiratory motion caused the region to                            be visualized in retroflexion just slightly but the                            majority of procedure required angulation/forward  viewing. Successful completion of the procedure was                            aided by changing the patient's position, using                            manual pressure, straightening and shortening the                            scope to obtain bowel loop reduction and using                            scope torsion. The patient tolerated the procedure.                            The quality of the bowel preparation was adequate.                            The ileocecal valve, appendiceal orifice, and                            rectum were photographed. Scope In: 12:57:51 PM Scope Out: 1:34:12 PM Scope Withdrawal Time: 0 hours 30 minutes 19 seconds  Total Procedure Duration: 0 hours 36 minutes 21 seconds  Findings:      The digital rectal exam findings include hemorrhoids. Pertinent       negatives include no palpable rectal lesions.      A large amount of semi-liquid stool was found in the entire colon,        interfering with visualization. Lavage of the area was performed using       copious amounts, resulting in clearance with fair visualization.      A 3 mm polyp was found in the ascending colon. The polyp was sessile.       The polyp was removed with a cold snare. Resection and retrieval were       complete.      A single (solitary) eighteen mm ulcer was found at the hepatic flexure -       consistent with previous EMR site (just proximal to the tattoo. No       bleeding was present. Stigmata of recent bleeding were present in       setting of some pigmented material within the ulcer bed. For hemostasis,       four hemostatic clips were successfully placed (MR conditional). There       was no bleeding at the end of the procedure.      Multiple small and large-mouthed diverticula were found in the entire       colon.      Non-bleeding non-thrombosed external and internal hemorrhoids were found       during retroflexion, during perianal exam and during digital exam. The       hemorrhoids were Grade II (internal hemorrhoids that prolapse but reduce       spontaneously). Impression:               - Hemorrhoids found on digital rectal exam.                           -  Stool in the entire examined colon. Lavaged with                            adequate visualization.                           - One 3 mm polyp in the ascending colon, removed                            with a cold snare. Resected and retrieved.                           - A single (solitary) ulcer at the hepatic flexure                            consistent with previous EMR site and proximal to                            the tattoo. Clips (MR conditional) were placed.                            Though overall ability and procedure was                            technically challenging as a result of respiratory                            motion and elongated Right colon.                           - Diverticulosis in the entire  examined colon.                           - Non-bleeding non-thrombosed external and internal                            hemorrhoids. Moderate Sedation:      Not Applicable - Patient had care per Anesthesia. Recommendation:           - The patient will be observed post-procedure,                            until all discharge criteria are met.                           - Return patient to hospital ward for ongoing care.                           - Advance diet as tolerated.                           - Await pathology results.                           - Repeat colonoscopy in 6-12 months for  surveillance as per Dr. Lynne Leader recommendations.                            Query utility of a Pediatric Colonoscope for easier                            retroflexion of the right colon.                           - Recommend IV Iron infusion x1 prior to discharge                            in order to optimize Iron stores.                           - Trend Hgb/Hct while in house.                           - If patient feels up to it, likely discharge is                            possible today vs tomorrow.                           - The findings and recommendations were discussed                            with the patient.                           - The findings and recommendations were discussed                            with the patient's family.                           - The findings and recommendations were discussed                            with the referring physician. Procedure Code(s):        --- Professional ---                           [phone], 59, Colonoscopy, flexible; with control of                            bleeding, any method                           45385, Colonoscopy, flexible; with removal of                            tumor(s), polyp(s), or other lesion(s) by snare                            technique  Diagnosis Code(s):        ---  Professional ---                           K64.1, Second degree hemorrhoids                           K63.5, Polyp of colon                           K63.3, Ulcer of intestine                           K91.840, Postprocedural hemorrhage of a digestive                            system organ or structure following a digestive                            system procedure                           K92.1, Melena (includes Hematochezia)                           K57.30, Diverticulosis of large intestine without                            perforation or abscess without bleeding CPT copyright 2019 American Medical Association. All rights reserved. The codes documented in this report are preliminary and upon coder review may  be revised to meet current compliance requirements. Justice Britain, MD 01/19/2020 1:44:55 PM Number of Addenda: 0

## 2020-01-19 NOTE — Anesthesia Procedure Notes (Signed)
Procedure Name: MAC Date/Time: 01/19/2020 12:48 PM Performed by: Cynda Familia, CRNA Pre-anesthesia Checklist: Patient identified, Emergency Drugs available, Suction available, Patient being monitored and Timeout performed Patient Re-evaluated:Patient Re-evaluated prior to induction Oxygen Delivery Method: Simple face mask Placement Confirmation: breath sounds checked- equal and bilateral and positive ETCO2

## 2020-01-19 NOTE — Interval H&P Note (Signed)
History and Physical Interval Note:  01/19/2020 12:43 PM  Jacqueline Gamble  has presented today for surgery, with the diagnosis of Hematochezia.  The various methods of treatment have been discussed with the patient and family. After consideration of risks, benefits and other options for treatment, the patient has consented to  Procedure(s): COLONOSCOPY WITH PROPOFOL (N/A) as a surgical intervention.  The patient's history has been reviewed, patient examined, no change in status, stable for surgery.  I have reviewed the patient's chart and labs.  Questions were answered to the patient's satisfaction.     [employer]

## 2020-01-19 NOTE — Discharge Summary (Signed)
Physician Discharge Summary  Jacqueline Gamble XBD:[id number] DOB: Jan 16, 1941   PCP: Galen Manila, MD  Admit date: 01/18/2020 Discharge date: 01/19/2020 Length of Stay: 0 days   Code Status: Full Code  Admitted From:  Home Discharged to:   [place]:  None  Equipment/Devices:  None Discharge Condition:  Stable  Recommendations for Outpatient Follow-up   1. Aspirin held at discharge, restart in 1 week 2. Can start PO iron supplementation in 1 week 3. Follow-up CBC this week  Hospital Summary  This is an 79 year old female with a past medical history of Parkinson's, hypertension, hypothyroidism, remote history of colon cancer who presents with rectal bleed.  Patient was in her usual state of health until few days ago after she had a colonoscopy recently.  She developed bright red blood per rectum with blood clots as well as increased fatigue and RLQ abdominal cramping.  Her recent GI note, she had a large 27 mm polypoid lesion near the previous tattoo suggestive of recurrent polyp which was resected in piecemeal however this was in a difficult area for the procedure.  In the ED, patient was hypertensive with normal heart rate.  Hb 11 and stable compared to baseline.  Red blood on rectal exam.  GI was consulted and recommended observation overnight with colonoscopy prep for following day  5/30: Patient underwent colonoscopy with Dr. Rush Landmark which showed one 3 mm polyp in the descending colon which was removed as well as a single ulcer at the hepatic flexure consistent with her previous EMR site and proximal to the tattoo.  Clips were placed.  Also noted to have nonbleeding internal/external hemorrhoids and diverticulosis.  Recommended advancing diet as tolerated, repeating colonoscopy in 6 to 12 months for surveillance and considering IV iron prior to discharge unless discharged on the same day at which point patient could start p.o. iron in 1 week.  Also hold aspirin x1  week.  Patient was able to tolerate advancement of diet and was discharged in stable condition with above recommendations.  A & P   Principal Problem:   BRBPR (bright red blood per rectum) Active Problems:   Obesity (BMI 30-39.9)   Malignant neoplasm of cecum (HCC)   Obstructive sleep apnea   Other specified hypothyroidism   Pulmonary hypertension ([place])   1. Rectal bleed s/p recent colonoscopy on 5/19 large polyp removed 1. repeat colonoscopy on 5/30 with Dr. Rush Landmark: single ulcer at the hepatic flexure consistent with her previous EMR site and proximal to the tattoo. Clips were placed. 2. Follow-up colonoscopy in 6 to 12 months 3. Repeat CBC in 1 week 4. Aspirin held x1 week 5. Start p.o. iron in 1 week (not prescribed at discharge)  2. Hypertension 1. Did not receive her BP meds as a.m. as she was n.p.o. prior to colonoscopy  2. Antihypertensives given prior to discharge 3. Continue home antihypertensives  3. Hypothyroidism on Synthroid 4. Parkinson's disease on Sinemet and amantadine 5. Hypokalemia, replaced    Consultants  . GI  Procedures  . 5/30: Colonoscopy  Antibiotics   Anti-infectives (From admission, onward)   None       Subjective  Patient seen and examined at bedside prior to colonoscopy with no acute distress and resting comfortably.  Discussed over the phone with the patient post procedure who stated she felt well.  No events overnight.  Tolerating diet. In good spirits and anticipating discharge.   Denies any chest pain, shortness of breath, fever, nausea, vomiting, urinary or  bowel complaints. Otherwise ROS negative    Objective   Discharge Exam: Vitals:   01/19/20 1554 01/19/20 1555  BP: (!) 166/106 (!) 166/106  Pulse: (!) 105   Resp:    Temp:    SpO2:     Vitals:   01/19/20 1400 01/19/20 1423 01/19/20 1554 01/19/20 1555  BP: (!) 144/77 (!) 166/106 (!) 166/106 (!) 166/106  Pulse: 70 (!) 105 (!) 105   Resp:  16    Temp: (!)  97.4 F (36.3 C) 97.6 F (36.4 C)    TempSrc:  Oral    SpO2: 97% 99%    Weight:      Height:        Physical Exam Vitals and nursing note reviewed.  Constitutional:      Appearance: Normal appearance.  HENT:     Head: Normocephalic and atraumatic.  Eyes:     Conjunctiva/sclera: Conjunctivae normal.  Cardiovascular:     Rate and Rhythm: Normal rate and regular rhythm.  Pulmonary:     Effort: Pulmonary effort is normal.     Breath sounds: Normal breath sounds.  Abdominal:     General: Abdomen is flat.     Palpations: Abdomen is soft.     Tenderness: There is abdominal tenderness in the right lower quadrant.  Musculoskeletal:        General: No swelling or tenderness.  Skin:    Coloration: Skin is not jaundiced or pale.  Neurological:     Mental Status: She is alert. Mental status is at baseline.  Psychiatric:        Mood and Affect: Mood normal.        Behavior: Behavior normal.       The results of significant diagnostics from this hospitalization (including imaging, microbiology, ancillary and laboratory) are listed below for reference.     Microbiology: Recent Results (from the past 240 hour(s))  SARS Coronavirus 2 by RT PCR (hospital order, performed in [facility] hospital lab) Nasopharyngeal Nasopharyngeal Swab     Status: None   Collection Time: 01/18/20  1:46 PM   Specimen: Nasopharyngeal Swab  Result Value Ref Range Status   SARS Coronavirus 2 NEGATIVE NEGATIVE Final    Comment: (NOTE) SARS-CoV-2 target nucleic acids are NOT DETECTED. The SARS-CoV-2 RNA is generally detectable in upper and lower respiratory specimens during the acute phase of infection. The lowest concentration of SARS-CoV-2 viral copies this assay can detect is 250 copies / mL. A negative result does not preclude SARS-CoV-2 infection and should not be used as the sole basis for treatment or other patient management decisions.  A negative result may occur with improper specimen  collection / handling, submission of specimen other than nasopharyngeal swab, presence of viral mutation(s) within the areas targeted by this assay, and inadequate number of viral copies (<250 copies / mL). A negative result must be combined with clinical observations, patient history, and epidemiological information. Fact Sheet for Patients:   StrictlyIdeas.no Fact Sheet for Healthcare Providers: BankingDealers.co.za This test is not yet approved or cleared  by the [place] FDA and has been authorized for detection and/or diagnosis of SARS-CoV-2 by FDA under an Emergency Use Authorization (EUA).  This EUA will remain in effect (meaning this test can be used) for the duration of the COVID-19 declaration under Section 564(b)(1) of the Act, 21 U.S.C. section 360bbb-3(b)(1), unless the authorization is terminated or revoked sooner. Performed at [facility], [place] [address], [place], [place] [address]  Labs: BNP (last 3 results) No results for input(s): BNP in the last 8760 hours. Basic Metabolic Panel: Recent Labs  Lab 01/18/20 1137 01/19/20 0513  NA 143 143  K 3.2* 3.4*  CL 107 111  CO2 26 24  GLUCOSE 129* 96  BUN 30* 22  CREATININE 1.29* 1.06*  CALCIUM 8.8* 8.6*   Liver Function Tests: Recent Labs  Lab 01/18/20 1137  AST 14*  ALT 14  ALKPHOS 93  BILITOT 0.9  PROT 6.2*  ALBUMIN 3.4*   No results for input(s): LIPASE, AMYLASE in the last 168 hours. No results for input(s): AMMONIA in the last 168 hours. CBC: Recent Labs  Lab 01/18/20 1137 01/18/20 2023 01/19/20 0513  WBC 6.1  --  6.1  HGB 11.7* 11.7* 10.5*  HCT 37.3 36.5 33.6*  MCV 96.1  --  96.6  PLT 279  --  259   Cardiac Enzymes: No results for input(s): CKTOTAL, CKMB, CKMBINDEX, TROPONINI in the last 168 hours. BNP: Invalid input(s): POCBNP CBG: No results for input(s): GLUCAP in the last 168 hours. D-Dimer No results for  input(s): DDIMER in the last 72 hours. Hgb A1c No results for input(s): HGBA1C in the last 72 hours. Lipid Profile No results for input(s): CHOL, HDL, LDLCALC, TRIG, CHOLHDL, LDLDIRECT in the last 72 hours. Thyroid function studies No results for input(s): TSH, T4TOTAL, T3FREE, THYROIDAB in the last 72 hours.  Invalid input(s): FREET3 Anemia work up No results for input(s): VITAMINB12, FOLATE, FERRITIN, TIBC, IRON, RETICCTPCT in the last 72 hours. Urinalysis    Component Value Date/Time   COLORURINE YELLOW 01/18/2020 1340   APPEARANCEUR CLEAR 01/18/2020 1340   LABSPEC 1.013 01/18/2020 1340   PHURINE 5.0 01/18/2020 1340   GLUCOSEU NEGATIVE 01/18/2020 1340   [place] 01/18/2020 1340   [place] 01/18/2020 [place] 01/18/2020 1340   PROTEINUR NEGATIVE 01/18/2020 1340   NITRITE NEGATIVE 01/18/2020 1340   LEUKOCYTESUR NEGATIVE 01/18/2020 1340   Sepsis Labs Invalid input(s): PROCALCITONIN,  WBC,  LACTICIDVEN Microbiology Recent Results (from the past 240 hour(s))  SARS Coronavirus 2 by RT PCR (hospital order, performed in [place] hospital lab) Nasopharyngeal Nasopharyngeal Swab     Status: None   Collection Time: 01/18/20  1:46 PM   Specimen: Nasopharyngeal Swab  Result Value Ref Range Status   SARS Coronavirus 2 NEGATIVE NEGATIVE Final    Comment: (NOTE) SARS-CoV-2 target nucleic acids are NOT DETECTED. The SARS-CoV-2 RNA is generally detectable in upper and lower respiratory specimens during the acute phase of infection. The lowest concentration of SARS-CoV-2 viral copies this assay can detect is 250 copies / mL. A negative result does not preclude SARS-CoV-2 infection and should not be used as the sole basis for treatment or other patient management decisions.  A negative result may occur with improper specimen collection / handling, submission of specimen other than nasopharyngeal swab, presence of viral mutation(s) within the areas  targeted by this assay, and inadequate number of viral copies (<250 copies / mL). A negative result must be combined with clinical observations, patient history, and epidemiological information. Fact Sheet for Patients:   StrictlyIdeas.no Fact Sheet for Healthcare Providers: BankingDealers.co.za This test is not yet approved or cleared  by the [place] FDA and has been authorized for detection and/or diagnosis of SARS-CoV-2 by FDA under an Emergency Use Authorization (EUA).  This EUA will remain in effect (meaning this test can be used) for the duration of the COVID-19 declaration under Section 564(b)(1) of  the Act, 21 U.S.C. section 360bbb-3(b)(1), unless the authorization is terminated or revoked sooner. Performed at [facility], [place] [address], [place], [place] [address]     Discharge Instructions     Discharge Instructions    Diet - low sodium heart healthy   Complete by: As directed    Discharge instructions   Complete by: As directed    - Do NOT take your aspirin for the next 7 days - continue taking your home medications as previously prescribed otherwise - follow up with your GI doctor - you will likely need a repeat colonoscopy in 6-12 months - if you have recurrence of your symptoms then don't hesitate to return to the ED.   Increase activity slowly   Complete by: As directed      Allergies as of 01/19/2020      Reactions   Iodine Swelling   Codeine Itching, Rash      Medication List    STOP taking these medications   ondansetron 4 MG tablet Commonly known as: ZOFRAN     TAKE these medications   albuterol 108 (90 Base) MCG/ACT inhaler Commonly known as: VENTOLIN HFA Inhale 2 puffs into the lungs every 6 (six) hours as needed for wheezing or shortness of breath.   Amantadine HCl 100 MG tablet Take 100 mg by mouth 2 (two) times daily.   amLODipine 10 MG tablet Commonly known as:  NORVASC Take 1 tablet (10 mg total) by mouth daily.   aspirin 81 MG tablet Take 81 mg by mouth daily.   atenolol 50 MG tablet Commonly known as: TENORMIN Take 50 mg by mouth daily.   carbidopa-levodopa 25-100 MG tablet Commonly known as: SINEMET IR Take 1 tablet by mouth in the morning and at bedtime.   dicyclomine 10 MG capsule Commonly known as: BENTYL TAKE ONE CAPSULE BY MOUTH THREE TIMES A DAY BEFORE MEALS What changed:   how much to take  how to take this  when to take this  reasons to take this  additional instructions   irbesartan 150 MG tablet Commonly known as: AVAPRO Take 150 mg by mouth daily.   levothyroxine 125 MCG tablet Commonly known as: SYNTHROID Take 62.5 mcg by mouth daily before breakfast.   pantoprazole 40 MG tablet Commonly known as: PROTONIX Take 1 tablet (40 mg total) by mouth 2 (two) times daily.   PRESERVISION AREDS PO Take 2 capsules by mouth daily.   torsemide 20 MG tablet Commonly known as: DEMADEX Take 20 mg by mouth daily.       Allergies  Allergen Reactions  . Iodine Swelling  . Codeine Itching and Rash    Dispo: The patient is from: Home              Anticipated d/c is to: Home              Anticipated d/c date is today              Patient currently is medically stable to d/c.       Time coordinating discharge: Over 30 minutes   SIGNED:   Harold Hedge, D.O. [facility] Pager: [phone]  01/19/2020, 4:16 PM

## 2020-01-19 NOTE — Transfer of Care (Signed)
Immediate Anesthesia Transfer of Care Note  Patient: [name]  Procedure(s) Performed: COLONOSCOPY WITH PROPOFOL (N/A ) POLYPECTOMY  Patient Location: PACU  Anesthesia Type:MAC  Level of Consciousness: awake and alert   Airway & Oxygen Therapy: Patient Spontanous Breathing and Patient connected to face mask oxygen  Post-op Assessment: Report given to RN and Post -op Vital signs reviewed and stable  Post vital signs: Reviewed stable  Last Vitals:  Vitals Value Taken Time  BP    Temp    Pulse 66 01/19/20 1343  Resp    SpO2 100 % 01/19/20 1343  Vitals shown include unvalidated device data.  Last Pain:  Vitals:   01/19/20 1244  TempSrc:   PainSc: 0-No pain         Complications: No apparent anesthesia complications

## 2020-01-19 NOTE — Anesthesia Preprocedure Evaluation (Addendum)
Anesthesia Evaluation  Patient identified by MRN, date of birth, ID band Patient awake    Reviewed: Allergy & Precautions, NPO status , Patient's Chart, lab work & pertinent test results, reviewed documented beta blocker date and time   History of Anesthesia Complications Negative for: history of anesthetic complications  Airway Mallampati: III  TM Distance: >3 FB Neck ROM: Full  Mouth opening: Limited Mouth Opening  Dental  (+) Partial Lower   Pulmonary asthma , sleep apnea , COPD,    Pulmonary exam normal        Cardiovascular hypertension, Pt. on medications and Pt. on home beta blockers Normal cardiovascular exam     Neuro/Psych Parkinson's negative psych ROS   GI/Hepatic Neg liver ROS, GERD  ,hematochezia   Endo/Other  Hypothyroidism   Renal/GU negative Renal ROS  negative genitourinary   Musculoskeletal  (+) Arthritis ,   Abdominal   Peds  Hematology  (+) Blood dyscrasia, anemia ,   Anesthesia Other Findings  Parkinson's, HTN, hypothyroid, OSA, remote hx colon cancer, s/p large polyp resection 5/19, presents with hematochezia Hgb 11.7->10.5  Echo 08/22/19: EF 50-55%, gr1dd, valves unremarkable  Reproductive/Obstetrics                            Anesthesia Physical Anesthesia Plan  ASA: III and emergent  Anesthesia Plan: MAC   Post-op Pain Management:    Induction: Intravenous  PONV Risk Score and Plan: 2 and Propofol infusion, TIVA and Treatment may vary due to age or medical condition  Airway Management Planned: Natural Airway and Nasal Cannula  Additional Equipment: None  Intra-op Plan:   Post-operative Plan:   Informed Consent: I have reviewed the patients History and Physical, chart, labs and discussed the procedure including the risks, benefits and alternatives for the proposed anesthesia with the patient or authorized representative who has indicated his/her  understanding and acceptance.       Plan Discussed with:   Anesthesia Plan Comments:        Anesthesia Quick Evaluation

## 2020-01-21 ENCOUNTER — Encounter: Payer: Self-pay | Admitting: *Deleted

## 2020-01-21 LAB — SURGICAL PATHOLOGY

## 2020-01-24 ENCOUNTER — Encounter: Payer: Self-pay | Admitting: Gastroenterology

## 2020-02-05 ENCOUNTER — Encounter: Payer: Self-pay | Admitting: Gastroenterology

## 2020-02-10 ENCOUNTER — Telehealth: Payer: Self-pay

## 2020-02-10 ENCOUNTER — Ambulatory Visit (INDEPENDENT_AMBULATORY_CARE_PROVIDER_SITE_OTHER): Payer: Medicare Other | Admitting: Gastroenterology

## 2020-02-10 ENCOUNTER — Encounter: Payer: Self-pay | Admitting: Gastroenterology

## 2020-02-10 ENCOUNTER — Other Ambulatory Visit (INDEPENDENT_AMBULATORY_CARE_PROVIDER_SITE_OTHER): Payer: Medicare Other

## 2020-02-10 VITALS — BP 120/82 | HR 77 | Ht 63.0 in | Wt 196.4 lb

## 2020-02-10 DIAGNOSIS — D62 Acute posthemorrhagic anemia: Secondary | ICD-10-CM

## 2020-02-10 DIAGNOSIS — Z86010 Personal history of colonic polyps: Secondary | ICD-10-CM

## 2020-02-10 LAB — CBC WITH DIFFERENTIAL/PLATELET
Basophils Absolute: 0 10*3/uL (ref 0.0–0.1)
Basophils Relative: 0.8 % (ref 0.0–3.0)
Eosinophils Absolute: 0.3 10*3/uL (ref 0.0–0.7)
Eosinophils Relative: 4.7 % (ref 0.0–5.0)
HCT: 34.9 % — ABNORMAL LOW (ref 36.0–46.0)
Hemoglobin: 11.6 g/dL — ABNORMAL LOW (ref 12.0–15.0)
Lymphocytes Relative: 22.3 % (ref 12.0–46.0)
Lymphs Abs: 1.3 10*3/uL (ref 0.7–4.0)
MCHC: 33.2 g/dL (ref 30.0–36.0)
MCV: 91.7 fl (ref 78.0–100.0)
Monocytes Absolute: 0.7 10*3/uL (ref 0.1–1.0)
Monocytes Relative: 11.5 % (ref 3.0–12.0)
Neutro Abs: 3.6 10*3/uL (ref 1.4–7.7)
Neutrophils Relative %: 60.7 % (ref 43.0–77.0)
Platelets: 281 10*3/uL (ref 150.0–400.0)
RBC: 3.81 Mil/uL — ABNORMAL LOW (ref 3.87–5.11)
RDW: 15.2 % (ref 11.5–15.5)
WBC: 5.9 10*3/uL (ref 4.0–10.5)

## 2020-02-10 NOTE — Telephone Encounter (Signed)
Left message to please call back, to give lab results

## 2020-02-10 NOTE — Patient Instructions (Addendum)
Your provider has requested that you go to the basement level for lab work before leaving today. Press "B" on the elevator. The lab is located at the first door on the left as you exit the elevator.   If you are age 80 or older, your body mass index should be between 23-30. Your Body mass index is 34.79 kg/m. If this is out of the aforementioned range listed, please consider follow up with your Primary Care Provider.  If you are age 45 or younger, your body mass index should be between 19-25. Your Body mass index is 34.79 kg/m. If this is out of the aformentioned range listed, please consider follow up with your Primary Care Provider.   Thank you for choosing me and [facility].  Pricilla Riffle. Dagoberto Ligas., MD., [facility] Regal

## 2020-02-10 NOTE — Progress Notes (Signed)
    History of Present Illness: This is an 79 year old female returning for follow-up after hospitalization for post polypectomy bleed.  A large adenomatous polyp was removed piecemeal at the hepatic flexure on May 19.  Technically somewhat difficult polypectomy due to location on the inner aspect of a curve with significant respiratory motion.  She was hospitalized on May 29 with a post polypectomy bleed and underwent repeat colonoscopy by Dr. Rush Landmark May 30 with no active bleeding noted.  Four clips were placed at the polypectomy site with stigmata of recent bleed.  No residual hepatic flexure polyp was noted.  A 3 mm ascending colon polyp was removed.  She states that intermittent right-sided abdominal pain has abated.  She has a bowel movement about every other day which is a typical pattern for her.  Her energy level has improved.  Hemoglobin 10.5 at discharge.  Current Medications, Allergies, Past Medical History, Past Surgical History, Family History and Social History were reviewed in [employer] record.   Physical Exam: General: Well developed, well nourished, no acute distress Head: Normocephalic and atraumatic Eyes:  sclerae anicteric, EOMI Ears: Normal auditory acuity Mouth: Not examined, mask on during Covid-19 pandemic Lungs: Clear throughout to auscultation Heart: Regular rate and rhythm; no murmurs, rubs or bruits Abdomen: Soft, non tender and non distended. No masses, hepatosplenomegaly or hernias noted. Normal Bowel sounds Rectal: Not done Musculoskeletal: Symmetrical with no gross deformities  Pulses:  Normal pulses noted Extremities: No clubbing, cyanosis, edema or deformities noted Neurological: Alert oriented x 4, resting UE tremor Psychological:  Alert and cooperative. Normal mood and affect   Assessment and Recommendations:  1.  Acute blood loss anemia due to post polypectomy bleed.  CBC today.  2.  Piecemeal polypectomy of a large tubular  adenoma.  Repeat colonoscopy in 6 months, November 2021, to assess for completeness of polypectomy.  3.  Intermittent right-sided abdominal pain.  Symptoms improved.  Continue dicyclomine 10 mg 3 times daily as needed.  4.  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  Follow-up EGD in February 2019 at [facility] showed no residual polyp.  5. GERD.  Continue pantoprazole 40 mg twice daily.  Follow standard antireflux measures.

## 2020-02-11 ENCOUNTER — Telehealth: Payer: Self-pay

## 2020-02-11 NOTE — Telephone Encounter (Signed)
Patient called back. I gave her CBC results

## 2020-08-31 ENCOUNTER — Emergency Department (HOSPITAL_COMMUNITY)
Admission: EM | Admit: 2020-08-31 | Discharge: 2020-08-31 | Disposition: A | Discharge status: Home / Self Care | Payer: Medicare Other | Attending: Emergency Medicine | Admitting: Emergency Medicine

## 2020-08-31 ENCOUNTER — Encounter (HOSPITAL_COMMUNITY): Payer: Self-pay

## 2020-08-31 ENCOUNTER — Other Ambulatory Visit: Payer: Self-pay

## 2020-08-31 DIAGNOSIS — Z85038 Personal history of other malignant neoplasm of large intestine: Secondary | ICD-10-CM | POA: Insufficient documentation

## 2020-08-31 DIAGNOSIS — Z79899 Other long term (current) drug therapy: Secondary | ICD-10-CM | POA: Insufficient documentation

## 2020-08-31 DIAGNOSIS — R040 Epistaxis: Secondary | ICD-10-CM | POA: Diagnosis not present

## 2020-08-31 DIAGNOSIS — E119 Type 2 diabetes mellitus without complications: Secondary | ICD-10-CM | POA: Insufficient documentation

## 2020-08-31 DIAGNOSIS — Z7982 Long term (current) use of aspirin: Secondary | ICD-10-CM | POA: Diagnosis not present

## 2020-08-31 DIAGNOSIS — I10 Essential (primary) hypertension: Secondary | ICD-10-CM | POA: Diagnosis not present

## 2020-08-31 DIAGNOSIS — E039 Hypothyroidism, unspecified: Secondary | ICD-10-CM | POA: Insufficient documentation

## 2020-08-31 LAB — BASIC METABOLIC PANEL
Anion gap: 10 (ref 5–15)
BUN: 24 mg/dL — ABNORMAL HIGH (ref 8–23)
CO2: 28 mmol/L (ref 22–32)
Calcium: 9.3 mg/dL (ref 8.9–10.3)
Chloride: 107 mmol/L (ref 98–111)
Creatinine, Ser: 1.36 mg/dL — ABNORMAL HIGH (ref 0.44–1.00)
GFR, Estimated: 39 mL/min — ABNORMAL LOW (ref >60)
Glucose, Bld: 122 mg/dL — ABNORMAL HIGH (ref 70–99)
Potassium: 3.8 mmol/L (ref 3.5–5.1)
Sodium: 145 mmol/L (ref 135–145)

## 2020-08-31 LAB — CBC WITH DIFFERENTIAL/PLATELET
Abs Immature Granulocytes: 0.01 10*3/uL (ref 0.00–0.07)
Basophils Absolute: 0.1 10*3/uL (ref 0.0–0.1)
Basophils Relative: 1 %
Eosinophils Absolute: 0.1 10*3/uL (ref 0.0–0.5)
Eosinophils Relative: 2 %
HCT: 41.1 % (ref 36.0–46.0)
Hemoglobin: 12.9 g/dL (ref 12.0–15.0)
Immature Granulocytes: 0 %
Lymphocytes Relative: 17 %
Lymphs Abs: 1.2 10*3/uL (ref 0.7–4.0)
MCH: 30.1 pg (ref 26.0–34.0)
MCHC: 31.4 g/dL (ref 30.0–36.0)
MCV: 95.8 fL (ref 80.0–100.0)
Monocytes Absolute: 0.5 10*3/uL (ref 0.1–1.0)
Monocytes Relative: 8 %
Neutro Abs: 4.9 10*3/uL (ref 1.7–7.7)
Neutrophils Relative %: 72 %
Platelets: 275 10*3/uL (ref 150–400)
RBC: 4.29 MIL/uL (ref 3.87–5.11)
RDW: 15 % (ref 11.5–15.5)
WBC: 6.8 10*3/uL (ref 4.0–10.5)
nRBC: 0 % (ref 0.0–0.2)

## 2020-08-31 MED ORDER — OXYMETAZOLINE HCL 0.05 % NA SOLN
1.0000 | Freq: Once | NASAL | Status: AC
  Administered 2020-08-31: 1 via NASAL
  Filled 2020-08-31: qty 30

## 2020-08-31 MED ORDER — TRANEXAMIC ACID FOR EPISTAXIS
500.0000 mg | Freq: Once | TOPICAL | Status: AC
  Administered 2020-08-31: 500 mg via TOPICAL
  Filled 2020-08-31: qty 10

## 2020-08-31 MED ORDER — HYDRALAZINE HCL 20 MG/ML IJ SOLN
10.0000 mg | Freq: Once | INTRAMUSCULAR | Status: DC
  Filled 2020-08-31: qty 1

## 2020-08-31 NOTE — ED Provider Notes (Signed)
[facility] DEPT Provider Note   CSN: [id number] Arrival date & time: 08/31/20  0756     History Chief Complaint  Patient presents with  . Epistaxis    Jacqueline Gamble is a 80 y.o. female history of colon cancer, hypertension, who presented with nosebleed.  She states that she woke around 4 AM and noticed there is blood in her nose.  She tried to hold pressure but is still oozing.  Patient is on baby aspirin no other blood thinners.  Denies any nose trauma.   The history is provided by the patient.       Past Medical History:  Diagnosis Date  . Cancer ([place])    colon  . Cataract    removed both eyes  . Colon polyp 2013   TUBULAR ADENOMA  . Common bile duct stone   . Constipation   . Family history of adverse reaction to anesthesia     " It takes a long time for my mother and brother to wake up."  . GERD (gastroesophageal reflux disease)   . Gout   . History of colon cancer 1980  . Hyperlipemia   . Hypertension   . Hypothyroidism   . Pneumonia   . Thyroid disease     Patient Active Problem List   Diagnosis Date Noted  . BRBPR (bright red blood per rectum) 01/18/2020  . Acute lower UTI 08/19/2019  . Acute urinary retention 08/19/2019  . AKI (acute kidney injury) ([place]) 08/19/2019  . UTI (urinary tract infection) 08/19/2019  . Other specified hypothyroidism 08/07/2019  . Diet-controlled diabetes mellitus ([place]) 11/30/2017  . 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 Choledocholithiasis   . Common bile duct calculi   . Elevated LFTs   . Fasting hyperglycemia 07/18/2016  . GI bleed 10/29/2015  . Gout 11/13/2013  . Abdominal pain, unspecified site 11/12/2013  . Personal history of colonic polyps 11/11/2013  . Acute cholecystitis with chronic cholecystitis 11/11/2013  . Obesity (BMI 30-39.9) 11/11/2013  . History of colon cancer   . GERD (gastroesophageal reflux disease)   . Hypertension   . Diverticulosis large intestine w/o  perforation or abscess w/o bleeding 11/04/2013  . Skin cancer 12/16/2010  . Acute bronchitis 08/20/2008  . Hematuria 10/29/2007  . Other long term (current) drug therapy 08/29/2007  . Thoracic or lumbosacral neuritis or radiculitis, unspecified 03/05/2007  . Chronic obstructive airway disease with asthma ([place]) 01/24/2007  . Obstructive sleep apnea 01/24/2007  . Pulmonary hypertension ([place]) 11/10/2006  . Bacterial pneumonia 08/30/2004  . Gout, arthritis 05/13/2004  . Hypercholesterolemia 03/19/2003  . Stress fracture of metatarsal bone 04/16/2001  . Absolute anemia 02/12/2001  . Edema 11/14/2000  . Idiopathic peripheral autonomic neuropathy 06/26/2000  . 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 ABDOMINAL HYSTERECTOMY    . APPENDECTOMY    . CHOLECYSTECTOMY N/A 11/12/2013   Procedure: LAPAROSCOPIC CHOLECYSTECTOMY WITH INTRAOPERATIVE CHOLANGIOGRAM;  Surgeon: Merrie Roof, MD;  Location: [facility];  Service: General;  Laterality: N/A;  . COLON SURGERY      2 times/1980 and 1983  . COLONOSCOPY  2013  . COLONOSCOPY  01/08/2020   2019-TA  . COLONOSCOPY WITH PROPOFOL N/A 01/19/2020   Procedure: COLONOSCOPY WITH PROPOFOL;  Surgeon: Rush Landmark Telford Nab., MD;  Location: [facility];  Service: Gastroenterology;  Laterality: N/A;  . ERCP N/A 07/28/2016   Procedure: ENDOSCOPIC RETROGRADE CHOLANGIOPANCREATOGRAPHY (ERCP);  Surgeon: Ladene Artist, MD;  Location: [facility];  Service: Endoscopy;  Laterality: N/A;  . HEMOSTASIS CLIP PLACEMENT  01/19/2020   Procedure: HEMOSTASIS CLIP PLACEMENT;  Surgeon: Irving Copas., MD;  Location: [facility];  Service: Gastroenterology;;  . POLYPECTOMY    . POLYPECTOMY  01/19/2020   Procedure: POLYPECTOMY;  Surgeon: Mansouraty, Telford Nab., MD;  Location: [facility];  Service: Gastroenterology;;  . TOTAL VAGINAL HYSTERECTOMY       OB History   No obstetric history on file.      Family History  Problem Relation Age of Onset  . Heart disease Mother   . Colon cancer Maternal Grandfather   . Colon polyps Son   . Stomach cancer Brother   . Breast cancer Sister   . Esophageal cancer Neg Hx   . Rectal cancer Neg Hx     Social History   Tobacco Use  . Smoking status: Never Smoker  . Smokeless tobacco: Never Used  Vaping Use  . Vaping Use: Never used  Substance Use Topics  . Alcohol use: No  . Drug use: No    Home Medications Prior to Admission medications   Medication Sig Start Date End Date Taking? Authorizing Provider  albuterol (VENTOLIN HFA) 108 (90 Base) MCG/ACT inhaler Inhale 2 puffs into the lungs every 6 (six) hours as needed for wheezing or shortness of breath. 08/23/19   Shelly Coss, MD  Amantadine HCl 100 MG tablet Take 100 mg by mouth 2 (two) times daily. 11/12/19   [provider]  amLODipine (NORVASC) 10 MG tablet Take 1 tablet (10 mg total) by mouth daily. 08/23/19 08/22/20  Shelly Coss, MD  aspirin 81 MG tablet Take 81 mg by mouth daily.    [provider]  atenolol (TENORMIN) 50 MG tablet Take 50 mg by mouth daily. 07/07/19   [provider]  carbidopa-levodopa (SINEMET IR) 25-100 MG tablet Take 1 tablet by mouth in the morning and at bedtime.     [provider]  dicyclomine (BENTYL) 10 MG capsule TAKE ONE CAPSULE BY MOUTH THREE TIMES A DAY BEFORE MEALS Patient taking differently: Take 10 mg by mouth 3 (three) times daily as needed for spasms.  01/03/19   Ladene Artist, MD  irbesartan (AVAPRO) 150 MG tablet Take 150 mg by mouth daily.    [provider]  levothyroxine (SYNTHROID, LEVOTHROID) 125 MCG tablet Take 62.5 mcg by mouth daily before breakfast.    [provider]  pantoprazole (PROTONIX) 40 MG tablet Take 1 tablet (40 mg total) by mouth 2 (two) times daily. 05/25/16   Ladene Artist, MD  torsemide (DEMADEX) 20 MG tablet Take 20 mg by mouth daily.    [provider]    Allergies    Iodine and Codeine  Review of Systems   Review of Systems  HENT: Positive for nosebleeds.   All other systems reviewed and are negative.   Physical Exam Updated Vital Signs BP (!) 202/88 (BP Location: Right Arm)   Pulse 86   Temp 98.2 F (36.8 C) (Oral)   Resp 16   Ht 5\' 3"  (1.6 m)   Wt 93 kg   SpO2 100%   BMI 36.31 kg/m   Physical Exam Vitals and nursing note reviewed.  Constitutional:      Appearance: Normal appearance.  HENT:     Head: Normocephalic.     Nose:     Comments: Nasal tampon in place in L nostril, tissues in R nostril. After they are removed, no  obvious source of bleeding found     Mouth/Throat:     Mouth: Mucous membranes are moist.  Eyes:     Extraocular Movements: Extraocular movements intact.     Pupils: Pupils are equal, round, and reactive to light.  Cardiovascular:     Rate and Rhythm: Normal rate and regular rhythm.     Pulses: Normal pulses.     Heart sounds: Normal heart sounds.  Pulmonary:     Effort: Pulmonary effort is normal.     Breath sounds: Normal breath sounds.  Abdominal:     General: Abdomen is flat.     Palpations: Abdomen is soft.  Musculoskeletal:        General: Normal range of motion.     Cervical back: Normal range of motion and neck supple.  Skin:    General: Skin is warm.     Capillary Refill: Capillary refill takes less than 2 seconds.  Neurological:     General: No focal deficit present.     Mental Status: She is alert.  Psychiatric:        Mood and Affect: Mood normal.        Behavior: Behavior normal.     ED Results / Procedures / Treatments   Labs (all labs ordered are listed, but only abnormal results are displayed) Labs Reviewed  CBC WITH DIFFERENTIAL/PLATELET  BASIC METABOLIC PANEL    EKG None  Radiology No results found.  Procedures .Epistaxis Management  Date/Time: 08/31/2020 11:27 AM Performed by: Drenda Freeze, MD Authorized by: Drenda Freeze, MD    Consent:    Consent obtained:  Verbal   Consent given by:  Patient   Risks, benefits, and alternatives were discussed: yes     Risks discussed:  Bleeding   Alternatives discussed:  No treatment Universal protocol:    Procedure explained and questions answered to patient or proxy's satisfaction: yes     Relevant documents present and verified: yes     Test results available: yes     Patient identity confirmed:  Verbally with patient Anesthesia:    Anesthesia method:  None Procedure details:    Treatment site:  R anterior and L anterior   Treatment method:  Anterior pack   Treatment complexity:  Limited   Treatment episode: initial   Post-procedure details:    Assessment:  Bleeding stopped   Procedure completion:  Tolerated Comments:     Applied TXA and afrin and bilateral nostrils packed with cotton balls. Bleeding stopped    (including critical care time)  Medications Ordered in ED Medications  hydrALAZINE (APRESOLINE) injection 10 mg (has no administration in time range)  oxymetazoline (AFRIN) 0.05 % nasal spray 1 spray (has no administration in time range)  tranexamic acid (CYKLOKAPRON) 1000 MG/10ML topical solution 500 mg (has no administration in time range)    ED Course  I have reviewed the triage vital signs and the nursing notes.  Pertinent labs & imaging results that were available during my care of the patient were reviewed by me and considered in my medical decision making (see chart for details).    MDM Rules/Calculators/A&P                         Jacqueline Gamble is a 80 y.o. female presenting with nosebleed.  Patient had nosebleed earlier today.  Patient is also hypertensive which could likely cause her bleeding.  Plan to get CBC and BMP.  I applied  Afrin and TXA and packed the nose and will reassess  11:28 AM CBG stable.  Patient tolerated p.o. in the ED.  Her blood pressure came down spontaneously.  I told her to use Afrin as needed.  Patient is stable  for discharge  Final Clinical Impression(s) / ED Diagnoses Final diagnoses:  None    Rx / DC Orders ED Discharge Orders    None       Drenda Freeze, MD 08/31/20 1128

## 2020-08-31 NOTE — ED Notes (Signed)
Removed tampon from left nare and withdrew a 3-4" blood clot. Placed small rhino rocket

## 2020-08-31 NOTE — ED Triage Notes (Signed)
Patient reports bleeding from both nostril, but L>R.  Patient has a tampon in the left nostril that she put up there.

## 2020-08-31 NOTE — Discharge Instructions (Signed)
Use afrin three times daily as needed if you have nosebleed then hold pressure for 20 minutes   Hold aspirin for several days   Use humidifier at night   The packing will eventually come out by itself.  See your doctor for follow-up   Return to ER if you have recurrent bleeding, vomiting blood

## 2020-09-10 ENCOUNTER — Ambulatory Visit (INDEPENDENT_AMBULATORY_CARE_PROVIDER_SITE_OTHER): Payer: Medicare Other | Admitting: Neurology

## 2020-09-10 ENCOUNTER — Encounter: Payer: Self-pay | Admitting: Neurology

## 2020-09-10 ENCOUNTER — Telehealth: Payer: Self-pay | Admitting: Neurology

## 2020-09-10 VITALS — BP 152/86 | HR 76 | Ht 63.0 in | Wt 205.0 lb

## 2020-09-10 DIAGNOSIS — Z9181 History of falling: Secondary | ICD-10-CM | POA: Diagnosis not present

## 2020-09-10 DIAGNOSIS — G20 Parkinson's disease: Secondary | ICD-10-CM

## 2020-09-10 MED ORDER — CARBIDOPA-LEVODOPA 25-100 MG PO TABS: 1.0000 | ORAL_TABLET | Freq: Three times a day (TID) | ORAL | 5 refills | Status: DC

## 2020-09-10 NOTE — Patient Instructions (Signed)
It was nice to meet you today.  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 It can affect your balance, your memory, your mood, your bowel and bladder function, your posture, balance and walking and your activities of daily living. However, there are good supportive treatments and symptomatic treatments available, so most patients have a change to a good quality life and life expectancy is not typically altered. I do want to suggest a few things today:  Remember to drink plenty of fluid at least 6 glasses (8 oz each), eat healthy meals and do not skip any meals. Try to eat protein with a every meal and eat a healthy snack such as fruit or nuts in between meals. Try to keep a regular sleep-wake schedule and try to exercise daily, particularly in the form of walking, 20-30 minutes a day, if you can.   Taking your medication on schedule is key.  Please increase your Sinemet, generic name is carbidopa-levodopa, 25-100 mg strength to 1 pill 3 times a day, at 8, noon, and 4 PM daily.   Please try to take the medication away from you mealtimes, that is, ideally either one hour before or 2 hours after your meal to ensure optimal absorption. The medication can interfere with the protein content of your meal and trying to the protein in your food and therefore not get fully absorbed.  Common side effects reported are: Nausea, vomiting, sedation, confusion, lightheadedness. Rare side effects include hallucinations, severe nausea or vomiting, diarrhea and significant drop in blood pressure especially when going from lying to standing or from sitting to standing.   Try to stay active physically and mentally.  Please use your cane or walker at all times.    I would like to suggest the following additional changes: Please reduce your amantadine 100 mg strength 1 pill once daily for the next 2 weeks and then stop it altogether.  As far as  diagnostic testing, we will do a brain scan, called MRI and call you with the test results. We will have to schedule you for this on a separate date. This test requires authorization from your insurance, and we will take care of the insurance process.  I would like to see you back in 3 months, sooner if we need to.  You can see one of our nurse practitioners next time.  Please call us with any interim questions, concerns, problems, updates or refill requests.  Our phone number is [phone]. We also have an after hours call service for urgent matters and there is a physician on-call for urgent questions, that cannot wait till the next work day. For any emergencies you know to call 911 or go to the nearest emergency room.   You can email me through my chart and also leave a phone message for Jinny Blossom, my nurse.

## 2020-09-10 NOTE — Telephone Encounter (Signed)
Medicare order sent to GI. No auth they will reach out to the patient to schedule.  

## 2020-09-10 NOTE — Progress Notes (Signed)
Subjective:    Patient ID: Jacqueline Gamble is a 80 y.o. female.  HPI      Star Age, MD, PhD [facility] [address] [place], [place] [address]  Dear Dr. Bobby Rumpf,   I saw your patient,  Jacqueline Gamble, upon your kind request in my neurologic clinic today for initial consultation and second opinion of her tremor, concern for parkinsonism.  The patient unaccompanied today. As. You know, [name] is an 80 year old right-handed woman with an underlying complex medical history of colon cancer, vitamin D deficiency, COPD, osteoarthritis, diabetes, chronic kidney disease, history of shingles, gout, reflux disease, hypertension, hyperlipidemia, hypothyroidism, history of pneumonia, and obesity, who reports that she was diagnosed with Parkinson's disease by another neurologist.  She is not sure who she saw.  She reports a right-sided tremor that started about 4 to 5 years ago and has become worse over the past year.  She has had a few falls recently.  She most recently fell in her basement.  She did not seek medical attention although she does report that she fell and hit her head.  Her husband helped her up.  She reports not having had any brain scan in the past such as MRI or CT scan.  Her medication list shows carbidopa-levodopa 25-100 mg strength 1 pill twice daily.  She reports that she has been taking it only once daily, unclear reasons.  She is also listed to take amantadine 100 mg twice daily, unclear how long she has been on it.  She was hospitalized in May of last year secondary to GI bleed.  She was on Sinemet generic at the time.  She was advised to continue with it.  More recently, she was seen in the emergency room secondary to epistaxis.  I reviewed your office note from 06/23/2020.  Prior neurology records are not available for my review today.  She requested a second opinion for her tremor.  She denies a family history of Parkinson's disease.  She is  not sure if the Sinemet is helping.  She has had decline in her mobility, increase in tremor, decline in dexterity.  Her Past Medical History Is Significant For: Past Medical History:  Diagnosis Date  . Cancer ([place])    colon  . Cataract    removed both eyes  . Colon polyp 2013   TUBULAR ADENOMA  . Common bile duct stone   . Constipation   . Family history of adverse reaction to anesthesia     " It takes a long time for my mother and brother to wake up."  . GERD (gastroesophageal reflux disease)   . Gout   . History of colon cancer 1980  . Hyperlipemia   . Hypertension   . Hypothyroidism   . Pneumonia   . Thyroid disease     Her Past Surgical History Is Significant For: Past Surgical History:  Procedure Laterality Date  . ABDOMINAL HYSTERECTOMY    . APPENDECTOMY    . CHOLECYSTECTOMY N/A 11/12/2013   Procedure: LAPAROSCOPIC CHOLECYSTECTOMY WITH INTRAOPERATIVE CHOLANGIOGRAM;  Surgeon: Merrie Roof, MD;  Location: [facility];  Service: General;  Laterality: N/A;  . COLON SURGERY      2 times/1980 and 1983  . COLONOSCOPY  2013  . COLONOSCOPY  01/08/2020   2019-TA  . COLONOSCOPY WITH PROPOFOL N/A 01/19/2020   Procedure: COLONOSCOPY WITH PROPOFOL;  Surgeon: Rush Landmark Telford Nab., MD;  Location: [facility];  Service: Gastroenterology;  Laterality: N/A;  . ERCP N/A 07/28/2016   Procedure: ENDOSCOPIC RETROGRADE CHOLANGIOPANCREATOGRAPHY (ERCP);  Surgeon: Ladene Artist, MD;  Location: [facility] ENDOSCOPY;  Service: Endoscopy;  Laterality: N/A;  . HEMOSTASIS CLIP PLACEMENT  01/19/2020   Procedure: HEMOSTASIS CLIP PLACEMENT;  Surgeon: Irving Copas., MD;  Location: [facility] ENDOSCOPY;  Service: Gastroenterology;;  . POLYPECTOMY    . POLYPECTOMY  01/19/2020   Procedure: POLYPECTOMY;  Surgeon: Mansouraty, Telford Nab., MD;  Location: [facility];  Service: Gastroenterology;;  . TOTAL VAGINAL HYSTERECTOMY      Her Family History Is Significant For: Family History  Problem Relation Age  of Onset  . Heart disease Mother   . Colon cancer Maternal Grandfather   . Colon polyps Son   . Stomach cancer Brother   . Breast cancer Sister   . Esophageal cancer Neg Hx   . Rectal cancer Neg Hx     Her Social History Is Significant For: Social History   Socioeconomic History  . Marital status: Married    Spouse name: Not on file  . Number of children: Not on file  . Years of education: Not on file  . Highest education level: Not on file  Occupational History  . Not on file  Tobacco Use  . Smoking status: Never Smoker  . Smokeless tobacco: Never Used  Vaping Use  . Vaping Use: Never used  Substance and Sexual Activity  . Alcohol use: No  . Drug use: No  . Sexual activity: Not on file  Other Topics Concern  . Not on file  Social History Narrative  . Not on file   Social Determinants of Health   Financial Resource Strain: Not on file  Food Insecurity: Not on file  Transportation Needs: Not on file  Physical Activity: Not on file  Stress: Not on file  Social Connections: Not on file    Her Allergies Are:  Allergies  Allergen Reactions  . Iodine Swelling  . Codeine Itching and Rash  :   Her Current Medications Are:  Outpatient Encounter Medications as of 09/10/2020  Medication Sig  . albuterol (VENTOLIN HFA) 108 (90 Base) MCG/ACT inhaler Inhale 2 puffs into the lungs every 6 (six) hours as needed for wheezing or shortness of breath.  . Amantadine HCl 100 MG tablet Take 100 mg by mouth 2 (two) times daily.  Marland Kitchen aspirin 81 MG tablet Take 81 mg by mouth daily.  Marland Kitchen atenolol (TENORMIN) 50 MG tablet Take 50 mg by mouth daily.  . carbidopa-levodopa (SINEMET IR) 25-100 MG tablet Take 1 tablet by mouth 3 (three) times daily. Take at 8 AM, noon and 4 PM daily  . dicyclomine (BENTYL) 10 MG capsule TAKE ONE CAPSULE BY MOUTH THREE TIMES A DAY BEFORE MEALS (Patient taking differently: Take 10 mg by mouth 3 (three) times daily as needed for spasms.)  . irbesartan (AVAPRO) 150  MG tablet Take 150 mg by mouth daily.  Marland Kitchen levothyroxine (SYNTHROID, LEVOTHROID) 125 MCG tablet Take 62.5 mcg by mouth daily before breakfast.  . pantoprazole (PROTONIX) 40 MG tablet Take 1 tablet (40 mg total) by mouth 2 (two) times daily.  Marland Kitchen torsemide (DEMADEX) 20 MG tablet Take 20 mg by mouth daily.  . [DISCONTINUED] carbidopa-levodopa (SINEMET IR) 25-100 MG tablet Take 1 tablet by mouth in the morning and at bedtime.   Marland Kitchen amLODipine (NORVASC) 10 MG tablet Take 1 tablet (10 mg total) by mouth daily.   Facility-Administered Encounter Medications as of 09/10/2020  Medication  . 0.9 %  sodium chloride infusion  :   Review of Systems:  Out of a complete 14 point review of systems, all are reviewed and negative with the exception of these symptoms as listed below:  Review of Systems  Neurological:       Here for consult on PD. Pt reports increased right hand and leg tremors. She sts over the last month or so tremors have developed with her mouth as well. Has been on Carb/Levo 25-100 1 per day for months now, has not noticed any benefit. She reports fatigue is troublesome for her. Report 3 falls over the last 6 months, no serious injuries. She does have a cane/walker and tries to be consistent with using them.     Objective:  Neurological Exam  Physical Exam Physical Examination:   Vitals:   09/10/20 0938  BP: (!) 152/86  Pulse: 76  SpO2: 96%   General Examination: The patient is a very pleasant 80 y.o. female in no acute distress. She appears well-developed and well-nourished and well groomed.   HEENT: Normocephalic, atraumatic, pupils are equal, round and reactive to light, status post bilateral cataract extractions.  Extraocular tracking is mildly impaired with saccadic breakdown noted in limitation in upgaze.  She has moderate facial masking.  Hearing is grossly intact, speech is mildly hypophonic, no dysarthria or voice tremor noted.  She has an intermittent lower jaw/chin tremor.   Moderate nuchal rigidity is noted.  No carotid bruits.  Airway examination reveals mild mouth dryness, tongue protrudes centrally and palate elevates symmetrically, no obvious sialorrhea.   Chest: Clear to auscultation without wheezing, rhonchi or crackles noted.  Heart: S1+S2+0, regular and normal without murmurs, rubs or gallops noted.   Abdomen: Soft, non-tender and non-distended with normal bowel sounds appreciated on auscultation.  Extremities: There is 1+ pitting edema in the distal lower extremities bilaterally.  Skin: Warm and dry without trophic changes noted.  Musculoskeletal: exam reveals no obvious joint deformities, tenderness or joint swelling or erythema.   Neurologically:  Mental status: The patient is awake, alert and oriented to self, situation, place, but overall memory and ability to give a detailed history is impaired.  Thought process is linear. Mood is normal and affect is normal.  Cranial nerves II - XII are as described above under HEENT exam. In addition: shoulder shrug is normal with equal shoulder height noted.  On 09/10/2020: On Archimedes spiral drawing she has moderate trembling with the right hand, significant insecurity with the nondominant, left hand but no obvious trembling, handwriting with the right hand is tremulous, small and difficult to read.  Motor exam: Normal bulk, global strength of 4+ out of 5.  She has a moderate resting tremor in the right upper extremity and an intermittent mild resting tremor in the right lower extremity.  She has a mild intermittent resting tremor in the left upper extremity and no resting tremor in the left lower extremity.  She has increased in tone in both upper extremities with significant cogwheeling noted in the right upper extremity.  Fine motor's testing shows moderate to severe impairment of finger taps in the right hand, moderate difficulty with the left hand, rapid alternating patting is moderately impaired  bilaterally.  Hand movements are moderately impaired bilaterally.  Foot taps or moderately to severely impaired with the right foot, moderately impaired with the left foot.  She leans to the left while sitting in the chair.  Right shoulder is higher than left shoulder.  Shoulder shrug is fairly equal.  Romberg is not tested for safety concerns.  Reflexes are 1+ in the upper extremities and diminished in the lower extremities.  She stands with difficulty and has to push herself up.  She stands wide-based.  She has a three-point cane which she holds on the right side.  She walks with decreased stride length and pace, decreased arm swing noted on the left.  She turns slowly.  Posture is moderately stooped.  Cerebellar testing: No dysmetria or intention tremor. Sensory exam: intact to light touch.   Assessment and Plan:   In summary, Jacqueline Gamble is a very pleasant 80 y.o.-year old female with an underlying complex medical history of colon cancer, vitamin D deficiency, COPD, osteoarthritis, diabetes, chronic kidney disease, history of shingles, gout, reflux disease, hypertension, hyperlipidemia, hypothyroidism, history of pneumonia, and obesity, who presents for evaluation of her parkinsonism.  Her history and examination are in keeping with right-sided predominant Parkinson's disease.  She has moderately advanced findings.  She reports a recent fall.  She has not had a brain MRI.  She is agreeable to pursuing a brain scan at this time, I will order an MRI without contrast, as she has chronic kidney disease.  She is advised to increase her Sinemet at this time to 1 pill 3 times daily.  I adjusted her prescription.  Furthermore, we talked about the importance of fall prevention and pursuing a healthy lifestyle, good nutrition, good hydration with water.  In addition, I would like for her to taper off amantadine as it can cause side effects including balance issues, sleepiness, hallucinations, particularly in the  elderly and I would favor that she come off of it.  She is agreeable and is therefore advised to reduce it to 1 pill once daily for the next 2 weeks and then stop it altogether.  She is advised that we will call her with her MRI results.  She is advised to follow-up in this clinic in about 3 months to see one of our nurse practitioners and to call in the interim with any questions or concerns.  Of note, she was not able to give a very elaborate or detail oriented history.  We will monitor for memory issues.  She is currently not labeled with any memory loss or diagnosis of dementia. I answered all her questions today and the patient was in agreement.   Thank you very much for allowing me to participate in the care of this nice patient. If I can be of any further assistance to you please do not hesitate to call me at [phone].  Sincerely,   Star Age, MD, PhD

## 2020-09-19 ENCOUNTER — Other Ambulatory Visit: Payer: Medicare Other

## 2020-09-27 ENCOUNTER — Other Ambulatory Visit: Payer: Self-pay

## 2020-09-27 ENCOUNTER — Ambulatory Visit
Admission: RE | Admit: 2020-09-27 | Discharge: 2020-09-27 | Disposition: A | Discharge status: Home / Self Care | Payer: Medicare Other | Source: Ambulatory Visit | Attending: Neurology | Admitting: Neurology

## 2020-09-27 DIAGNOSIS — Z9181 History of falling: Secondary | ICD-10-CM

## 2020-09-27 DIAGNOSIS — G20 Parkinson's disease: Secondary | ICD-10-CM

## 2020-09-28 NOTE — Progress Notes (Signed)
Please call patient and advise her that her brain MRI without contrast showed no acute findings.  She has had extensive chronic changes.  She has what we call white matter changes which are blood vessel related changes, likely from chronic high blood pressure, also can be seen with aging and other risk factors such as migraines or smoking.  The findings in her case were deemed advanced for age.    She has had small areas of hemorrhages, i.e. chronic small areas of bleeding.  These are called microhemorrhages as the areas of bleeding are very small.  This is often seen in patients who have chronic high blood pressure, particularly difficult to control or uncontrolled hypertension.  Overall, there were no acute findings.  She is advised to follow-up with primary care physician for ongoing risk factor management including blood pressure management, cholesterol management, weight management.  She can follow-up in this clinic as scheduled with the nurse practitioner.

## 2020-09-29 ENCOUNTER — Telehealth: Payer: Self-pay

## 2020-09-29 NOTE — Telephone Encounter (Signed)
-----   Message from Star Age, MD sent at 09/28/2020  4:35 PM EST ----- Please call patient and advise her that her brain MRI without contrast showed no acute findings.  She has had extensive chronic changes.  She has what we call white matter changes which are blood vessel related changes, likely from chronic high blood pressure, also can be seen with aging and other risk factors such as migraines or smoking.  The findings in her case were deemed advanced for age.    She has had small areas of hemorrhages, i.e. chronic small areas of bleeding.  These are called microhemorrhages as the areas of bleeding are very small.  This is often seen in patients who have chronic high blood pressure, particularly difficult to control or uncontrolled hypertension.  Overall, there were no acute findings.  She is advised to follow-up with primary care physician for ongoing risk factor management including blood pressure management, cholesterol management, weight management.  She can follow-up in this clinic as scheduled with the nurse practitioner.

## 2020-09-29 NOTE — Telephone Encounter (Signed)
I called the pt and we reviewed MRI results. She verbalized understanding and had no questions/concerns.

## 2020-09-30 ENCOUNTER — Encounter: Payer: Self-pay | Admitting: Gastroenterology

## 2020-10-12 ENCOUNTER — Other Ambulatory Visit: Payer: Self-pay | Admitting: Nephrology

## 2020-10-12 ENCOUNTER — Other Ambulatory Visit (HOSPITAL_COMMUNITY): Payer: Self-pay | Admitting: Nephrology

## 2020-10-12 DIAGNOSIS — N281 Cyst of kidney, acquired: Secondary | ICD-10-CM

## 2020-10-21 ENCOUNTER — Ambulatory Visit (HOSPITAL_COMMUNITY)
Admission: RE | Admit: 2020-10-21 | Discharge: 2020-10-21 | Disposition: A | Discharge status: Home / Self Care | Payer: Medicare Other | Source: Ambulatory Visit | Attending: Nephrology | Admitting: Nephrology

## 2020-10-21 ENCOUNTER — Other Ambulatory Visit: Payer: Self-pay

## 2020-10-21 DIAGNOSIS — N281 Cyst of kidney, acquired: Secondary | ICD-10-CM | POA: Diagnosis not present

## 2020-12-29 NOTE — Patient Instructions (Signed)
Below is our plan:  We will discontinue amantadine and carbidopa/levedopa. We will try a low dose of ropinirole. Please start with 1 tablet at bedtime for 1-2 weeks. If you tolerate this dose then increase to 1 tablet twice daily. May consider increasing to three times daily in the future if you tolerate it well. Please be very careful. Fall precautions advised. I will be happy to refer you to [facility] for consideration of other treatment options if you wish.   Please make sure you are staying well hydrated. I recommend 50-60 ounces daily. Well balanced diet and regular exercise encouraged. Consistent sleep schedule with 6-8 hours recommended.   Please continue follow up with care team as directed.   Follow up with Dr Rexene Alberts in 3-4 months   You may receive a survey regarding today's visit. I encourage you to leave honest feed back as I do use this information to improve patient care. Thank you for seeing me today!   Ropinirole Oral Tablets What is this medicine? ROPINIROLE (roe PIN i role) is used to treat symptoms of Parkinson's disease. It is also used to treat Restless Legs Syndrome. This medicine may be used for other purposes; ask your health care provider or pharmacist if you have questions. COMMON BRAND NAME(S): Requip What should I tell my health care provider before I take this medicine? They need to know if you have any of these conditions:  heart disease  high blood pressure  kidney disease  liver disease  low blood pressure  narcolepsy  sleep apnea  smoke tobacco cigarettes  an unusual or allergic reaction to ropinirole, other medicines, foods, dyes, or preservatives  pregnant or trying to get pregnant  breast-feeding How should I use this medicine? Take this medicine by mouth with water. Take it as directed on the prescription label. You can take it with or without food. If it upsets your stomach, take it with food. Keep taking this medicine unless your  health care provider tells you to stop. Stopping it too quickly can cause serious side effects. It can also make your condition worse. Talk to your health care provider about the use of this medicine in children. Special care may be needed. Overdosage: If you think you have taken too much of this medicine contact a poison control center or emergency room at once. NOTE: This medicine is only for you. Do not share this medicine with others. What if I miss a dose? If you miss a dose, take it as soon as you can. If it is almost time for your next dose, take only that dose. Do not take double or extra doses. What may interact with this medicine?  alcohol  antihistamines for allergy, cough and cold  certain medicines for depression, anxiety, or psychotic disturbances  certain medicines for seizures like phenobarbital, primidone  certain medicines for sleep  ciprofloxacin  female hormones, like estrogens and birth control pills  fluvoxamine  general anesthetics like halothane, isoflurane, methoxyflurane, propofol  medicines for blood pressure  medicines that relax muscles for surgery  metoclopramide  narcotic medicines for pain  rifampin  tobacco smoking This list may not describe all possible interactions. Give your health care provider a list of all the medicines, herbs, non-prescription drugs, or dietary supplements you use. Also tell them if you smoke, drink alcohol, or use illegal drugs. Some items may interact with your medicine. What should I watch for while using this medicine? Visit your health care provider for regular checks on  your progress. Tell your health care provider if your symptoms do not start to get better or if they get worse. Do not suddenly stop taking this medicine. You may develop a severe reaction. Your health care provider will tell you how much medicine to take. If your health care provider wants you to stop the medicine, the dose may be slowly lowered  over time to avoid any side effects. You may get drowsy or dizzy. Do not drive, use machinery, or do anything that needs mental alertness until you know how this drug affects you. Do not stand or sit up quickly, especially if you are an older patient. This reduces the risk of dizzy or fainting spells. Alcohol may interfere with the effect of this medicine. Avoid alcoholic drinks. When taking this medicine, you may fall asleep without notice. You may be doing activities like driving a car, talking, or eating. You may not feel drowsy before it happens. Contact your health care provider right away if this happens to you. There have been reports of increased sexual urges or other strong urges such as gambling while taking this medicine. If you experience any of these while taking this medicine, you should report this to your health care provider as soon as possible. Your mouth may get dry. Chewing sugarless gum or sucking hard candy and drinking plenty of water may help. Contact your health care provider if the problem does not go away or is severe. What side effects may I notice from receiving this medicine? Side effects that you should report to your doctor or health care professional as soon as possible:  allergic reactions (skin rash, itching or hives; swelling of the face, lips, or tongue)  changes in emotions or moods  changes in vision  confusion  depressed mood  increased blood pressure  falling asleep during normal activities like driving  fast, irregular heartbeat  hallucinations  low blood pressure (dizziness; feeling faint or lightheaded, falls; unusually weak or tired)  new or increased gambling urges, sexual urges, uncontrolled spending, binge or compulsive eating, or other urges  uncontrollable head, mouth, neck, arm, or leg movements Side effects that usually do not require medical attention (report to your doctor or health care professional if they continue or are  bothersome):  constipation  dizziness  drowsiness  dry mouth  headache  nausea This list may not describe all possible side effects. Call your doctor for medical advice about side effects. You may report side effects to FDA at 1-800-FDA-1088. Where should I keep my medicine? Keep out of the reach of children and pets. Store at room temperature between 20 and 25 degrees C (68 and 77 degrees F). Protect from light and moisture. Keep the container tightly closed. Get rid of any unused medicine after the expiration date. To get rid of medicines that are no longer needed or have expired:  Take the medicine to a medicine take-back program. Check with your pharmacy or law enforcement to find a location.  If you cannot return the medicine, check the label or package insert to see if the medicine should be thrown out in the garbage or flushed down the toilet. If you are not sure, ask your health care provider. If it is safe to put it in the trash, take the medicine out of the container. Mix the medicine with cat litter, dirt, coffee grounds, or other unwanted substance. Seal the mixture in a bag or container. Put it in the trash. NOTE: This sheet is a  summary. It may not cover all possible information. If you have questions about this medicine, talk to your doctor, pharmacist, or health care provider.  2021 Elsevier/Gold Standard (2020-03-31 20:54:24)   Fall Prevention in the Home, Adult Falls can cause injuries and can happen to people of all ages. There are many things you can do to make your home safe and to help prevent falls. Ask for help when making these changes. What actions can I take to prevent falls? General Instructions  Use good lighting in all rooms. Replace any light bulbs that burn out.  Turn on the lights in dark areas. Use night-lights.  Keep items that you use often in easy-to-reach places. Lower the shelves around your home if needed.  Set up your furniture so you  have a clear path. Avoid moving your furniture around.  Do not have throw rugs or other things on the floor that can make you trip.  Avoid walking on wet floors.  If any of your floors are uneven, fix them.  Add color or contrast paint or tape to clearly mark and help you see: ? Grab bars or handrails. ? First and last steps of staircases. ? Where the edge of each step is.  If you use a stepladder: ? Make sure that it is fully opened. Do not climb a closed stepladder. ? Make sure the sides of the stepladder are locked in place. ? Ask someone to hold the stepladder while you use it.  Know where your pets are when moving through your home. What can I do in the bathroom?  Keep the floor dry. Clean up any water on the floor right away.  Remove soap buildup in the tub or shower.  Use nonskid mats or decals on the floor of the tub or shower.  Attach bath mats securely with double-sided, nonslip rug tape.  If you need to sit down in the shower, use a plastic, nonslip stool.  Install grab bars by the toilet and in the tub and shower. Do not use towel bars as grab bars.      What can I do in the bedroom?  Make sure that you have a light by your bed that is easy to reach.  Do not use any sheets or blankets for your bed that hang to the floor.  Have a firm chair with side arms that you can use for support when you get dressed. What can I do in the kitchen?  Clean up any spills right away.  If you need to reach something above you, use a step stool with a grab bar.  Keep electrical cords out of the way.  Do not use floor polish or wax that makes floors slippery. What can I do with my stairs?  Do not leave any items on the stairs.  Make sure that you have a light switch at the top and the bottom of the stairs.  Make sure that there are handrails on both sides of the stairs. Fix handrails that are broken or loose.  Install nonslip stair treads on all your stairs.  Avoid  having throw rugs at the top or bottom of the stairs.  Choose a carpet that does not hide the edge of the steps on the stairs.  Check carpeting to make sure that it is firmly attached to the stairs. Fix carpet that is loose or worn. What can I do on the outside of my home?  Use bright outdoor lighting.  Fix the  edges of walkways and driveways and fix any cracks.  Remove anything that might make you trip as you walk through a door, such as a raised step or threshold.  Trim any bushes or trees on paths to your home.  Check to see if handrails are loose or broken and that both sides of all steps have handrails.  Install guardrails along the edges of any raised decks and porches.  Clear paths of anything that can make you trip, such as tools or rocks.  Have leaves, snow, or ice cleared regularly.  Use sand or salt on paths during winter.  Clean up any spills in your garage right away. This includes grease or oil spills. What other actions can I take?  Wear shoes that: ? Have a low heel. Do not wear high heels. ? Have rubber bottoms. ? Feel good on your feet and fit well. ? Are closed at the toe. Do not wear open-toe sandals.  Use tools that help you move around if needed. These include: ? Canes. ? Walkers. ? Scooters. ? Crutches.  Review your medicines with your doctor. Some medicines can make you feel dizzy. This can increase your chance of falling. Ask your doctor what else you can do to help prevent falls. Where to find more information  Centers for Disease Control and Prevention, [employer]: http://www.wolf.info/  National Institute on Aging: http://kim-miller.com/ Contact a doctor if:  You are afraid of falling at home.  You feel weak, drowsy, or dizzy at home.  You fall at home. Summary  There are many simple things that you can do to make your home safe and to help prevent falls.  Ways to make your home safe include removing things that can make you trip and installing grab  bars in the bathroom.  Ask for help when making these changes in your home. This information is not intended to replace advice given to you by your health care provider. Make sure you discuss any questions you have with your health care provider. Document Revised: 03/11/2020 Document Reviewed: 03/11/2020 Elsevier Patient Education  2021 [place] Disease Parkinson's disease causes problems with movements. It is a long-term condition. It gets worse over time (is progressive). It affects each person in different ways. It makes it harder for you to:  Control how your body moves.  Move your body normally. The condition can range from mild to very bad (advanced). What are the causes? This condition results from a loss of brain cells called neurons. These brain cells make a chemical called dopamine, which is needed to control body movement. As the condition gets worse, the brain cells make less dopamine. This makes it hard to move or control your movements. The exact cause of this condition is not known. What increases the risk?  Being female.  Being age 68 or older.  Having family members who had Parkinson's disease.  Having had an injury to the brain.  Being very sad (depressed).  Being around things that are harmful or poisonous. What are the signs or symptoms? Symptoms of this condition can vary. The main symptoms have to do with movement. These include:  A tremor or shaking while you are resting that you cannot control.  Stiffness in your neck, arms, and legs.  Slowing of movement. This may include: ? Losing expressions of the face. ? Having trouble making small movements that are needed to button your clothing or brush your teeth.  Walking in a way that is  not normal. You may walk with short, shuffling steps.  Loss of balance when standing. You may sway, fall backward, or have trouble making turns. Other symptoms include:  Being very sad, worried, or  confused.  Seeing or hearing things that are not real.  Losing thinking abilities (dementia).  Trouble speaking or swallowing.  Having a hard time pooping (constipation).  Needing to pee right away, peeing often, or not being able to control when you pee or poop.  Sleep problems.   How is this treated? There is no cure. The goal of treatment is to manage your symptoms. Treatment may include:  Medicines.  Therapy to help with talking or movement.  Surgery to reduce shaking and other movements that you cannot control. Follow these instructions at home: Medicines  Take over-the-counter and prescription medicines only as told by your doctor.  Avoid taking pain or sleeping medicines. Eating and drinking  Follow instructions from your doctor about what you cannot eat or drink.  Do not drink alcohol. Activity  Talk with your doctor about if it is safe for you to drive.  Do exercises as told by your doctor. Lifestyle  Put in grab bars and railings in your home. These help to prevent falls.  Do not use any products that contain nicotine or tobacco, such as cigarettes, e-cigarettes, and chewing tobacco. If you need help quitting, ask your doctor.  Join a support group.      General instructions  Talk with your doctor about what you need help with and what your safety needs are.  Keep all follow-up visits as told by your doctor, including any therapy visits to help with talking or moving. This is important. Contact a doctor if:  Medicines do not help your symptoms.  You feel off-balance.  You fall at home.  You need more help at home.  You have trouble swallowing.  You have a very hard time pooping.  You have a lot of side effects from your medicines.  You feel very sad, worried, or confused. Get help right away if:  You were hurt in a fall.  You see or hear things that are not real.  You cannot swallow without choking.  You have chest pain or trouble  breathing.  You do not feel safe at home.  You have thoughts about hurting yourself or others. If you ever feel like you may hurt yourself or others, or have thoughts about taking your own life, get help right away. You can go to your nearest emergency department or call:  Your local emergency services (911 in the [place]  A suicide crisis helpline, such as the [place] at 949-394-2964. This is open 24 hours a day. Summary  This condition causes problems with movements.  It is a long-term condition. It gets worse over time.  There is no cure. Treatment focuses on managing your symptoms.  Talk with your doctor about what you need help with and what your safety needs are.  Keep all follow-up visits as told by your doctor. This is important. This information is not intended to replace advice given to you by your health care provider. Make sure you discuss any questions you have with your health care provider. Document Revised: 10/25/2018 Document Reviewed: 10/25/2018 Elsevier Patient Education  [place].

## 2020-12-29 NOTE — Progress Notes (Addendum)
Chief Complaint  Patient presents with  . Follow-up    RM 1, alone, pt took sinemet for 1 month, then cut down to half a pill for 3 weeks and later D/C due to side effects, pt states she had an upset stomach w/ diarrhea. Per pt "froze her toes and hands and couldn't move."      HISTORY OF PRESENT ILLNESS: 12/30/20 ALL:  Jacqueline Gamble is a 80 y.o. female here today for follow up for PD. MRI showed extensive white matter changes and microhemorrhages.  Sinemet was increased to 1 tablet TID and amantadine was discontinued. She reports that she was unable to tolerate increased dose of Sinemet. She felt that GI distress was not improving, even when taking with food. She cut tablet back to 1/21 tablet three times daily but did not feel it help and has since discontinued completely. She reports that right arm and leg tremor wax and wane. She seems to do ok during the day but has worsening symptoms at night. She reports that she has not been able to rest due to her right leg shaking so badly at night.   She is able to perform ADLs independently. She lives with her husband.  She feels memory is fairly good. She is able to manage all finances without assistance. She does not drive due to right leg tremor. She has had a couple of falls since last being seen. She feel down 1 step about 2-3 weeks ago. She has seen PCP. She completed PT about 8 weeks ago. She reports using a walker around the home but does not have with her today. She is eating normally.    HISTORY (copied from Dr Guadelupe Sabin previous note)  Dear Dr. Bobby Rumpf,   I saw your patient,  Jacqueline Gamble, upon your kind request in my neurologic clinic today for initial consultation and second opinion of her tremor, concern for parkinsonism.  The patient unaccompanied today. As. You know, Jacqueline Gamble is an 80 year old right-handed woman with an underlying complex medical history of colon cancer, vitamin D deficiency, COPD, osteoarthritis, diabetes, chronic  kidney disease, history of shingles, gout, reflux disease, hypertension, hyperlipidemia, hypothyroidism, history of pneumonia, and obesity, who reports that she was diagnosed with Parkinson's disease by another neurologist.  She is not sure who she saw.  She reports a right-sided tremor that started about 4 to 5 years ago and has become worse over the past year.  She has had a few falls recently.  She most recently fell in her basement.  She did not seek medical attention although she does report that she fell and hit her head.  Her husband helped her up.  She reports not having had any brain scan in the past such as MRI or CT scan.  Her medication list shows carbidopa-levodopa 25-100 mg strength 1 pill twice daily.  She reports that she has been taking it only once daily, unclear reasons.  She is also listed to take amantadine 100 mg twice daily, unclear how long she has been on it.  She was hospitalized in May of last year secondary to GI bleed.  She was on Sinemet generic at the time.  She was advised to continue with it.  More recently, she was seen in the emergency room secondary to epistaxis.  I reviewed your office note from 06/23/2020.  Prior neurology records are not available for my review today.  She requested a second opinion for her tremor.  She denies a  family history of Parkinson's disease.  She is not sure if the Sinemet is helping.  She has had decline in her mobility, increase in tremor, decline in dexterity.    REVIEW OF SYSTEMS: Out of a complete 14 system review of symptoms, the patient complains only of the following symptoms, right upper and lower extremity tremor, right knee pain, dizziness, falls, and all other reviewed systems are negative.    ALLERGIES: Allergies  Allergen Reactions  . Iodine Swelling  . Codeine Itching and Rash     HOME MEDICATIONS: Outpatient Medications Prior to Visit  Medication Sig Dispense Refill  . albuterol (VENTOLIN HFA) 108 (90 Base) MCG/ACT  inhaler Inhale 2 puffs into the lungs every 6 (six) hours as needed for wheezing or shortness of breath. 8 g 1  . aspirin 81 MG tablet Take 81 mg by mouth daily.    Marland Kitchen atenolol (TENORMIN) 50 MG tablet Take 50 mg by mouth daily.    Marland Kitchen dicyclomine (BENTYL) 10 MG capsule TAKE ONE CAPSULE BY MOUTH THREE TIMES A DAY BEFORE MEALS 90 capsule 5  . irbesartan (AVAPRO) 150 MG tablet Take 150 mg by mouth daily.    Marland Kitchen levothyroxine (SYNTHROID, LEVOTHROID) 125 MCG tablet Take 62.5 mcg by mouth daily before breakfast.    . pantoprazole (PROTONIX) 40 MG tablet Take 1 tablet (40 mg total) by mouth 2 (two) times daily. 60 tablet 5  . torsemide (DEMADEX) 20 MG tablet Take 20 mg by mouth daily.    . Amantadine HCl 100 MG tablet Take 100 mg by mouth 2 (two) times daily.    Marland Kitchen amLODipine (NORVASC) 10 MG tablet Take 1 tablet (10 mg total) by mouth daily. 30 tablet 1  . carbidopa-levodopa (SINEMET IR) 25-100 MG tablet Take 1 tablet by mouth 3 (three) times daily. Take at 8 AM, noon and 4 PM daily 90 tablet 5   Facility-Administered Medications Prior to Visit  Medication Dose Route Frequency Provider Last Rate Last Admin  . 0.9 %  sodium chloride infusion  500 mL Intravenous Once Ladene Artist, MD         PAST MEDICAL HISTORY: Past Medical History:  Diagnosis Date  . Cancer ([place])    colon  . Cataract    removed both eyes  . Colon polyp 2013   TUBULAR ADENOMA  . Common bile duct stone   . Constipation   . Family history of adverse reaction to anesthesia     " It takes a long time for my mother and brother to wake up."  . GERD (gastroesophageal reflux disease)   . Gout   . History of colon cancer 1980  . Hyperlipemia   . Hypertension   . Hypothyroidism   . Pneumonia   . Thyroid disease      PAST SURGICAL HISTORY: Past Surgical History:  Procedure Laterality Date  . ABDOMINAL HYSTERECTOMY    . APPENDECTOMY    . CHOLECYSTECTOMY N/A 11/12/2013   Procedure: LAPAROSCOPIC CHOLECYSTECTOMY WITH  INTRAOPERATIVE CHOLANGIOGRAM;  Surgeon: Merrie Roof, MD;  Location: [facility];  Service: General;  Laterality: N/A;  . COLON SURGERY      2 times/1980 and 1983  . COLONOSCOPY  2013  . COLONOSCOPY  01/08/2020   2019-TA  . COLONOSCOPY WITH PROPOFOL N/A 01/19/2020   Procedure: COLONOSCOPY WITH PROPOFOL;  Surgeon: Rush Landmark Telford Nab., MD;  Location: [facility];  Service: Gastroenterology;  Laterality: N/A;  . ERCP N/A 07/28/2016   Procedure: ENDOSCOPIC RETROGRADE CHOLANGIOPANCREATOGRAPHY (ERCP);  Surgeon: Ladene Artist, MD;  Location: [facility] ENDOSCOPY;  Service: Endoscopy;  Laterality: N/A;  . HEMOSTASIS CLIP PLACEMENT  01/19/2020   Procedure: HEMOSTASIS CLIP PLACEMENT;  Surgeon: Irving Copas., MD;  Location: [facility] ENDOSCOPY;  Service: Gastroenterology;;  . POLYPECTOMY    . POLYPECTOMY  01/19/2020   Procedure: POLYPECTOMY;  Surgeon: Mansouraty, Telford Nab., MD;  Location: [facility];  Service: Gastroenterology;;  . TOTAL VAGINAL HYSTERECTOMY       FAMILY HISTORY: Family History  Problem Relation Age of Onset  . Heart disease Mother   . Colon cancer Maternal Grandfather   . Colon polyps Son   . Stomach cancer Brother   . Breast cancer Sister   . Esophageal cancer Neg Hx   . Rectal cancer Neg Hx      SOCIAL HISTORY: Social History   Socioeconomic History  . Marital status: Married    Spouse name: Not on file  . Number of children: Not on file  . Years of education: Not on file  . Highest education level: Not on file  Occupational History  . Not on file  Tobacco Use  . Smoking status: Never Smoker  . Smokeless tobacco: Never Used  Vaping Use  . Vaping Use: Never used  Substance and Sexual Activity  . Alcohol use: No  . Drug use: No  . Sexual activity: Not on file  Other Topics Concern  . Not on file  Social History Narrative  . Not on file   Social Determinants of Health   Financial Resource Strain: Not on file  Food Insecurity: Not on file   Transportation Needs: Not on file  Physical Activity: Not on file  Stress: Not on file  Social Connections: Not on file  Intimate Partner Violence: Not on file      PHYSICAL EXAM  Vitals:   12/30/20 0946  BP: (!) 144/82  Pulse: 80  Weight: 201 lb (91.2 kg)  Height: 5\' 3"  (1.6 m)   Body mass index is 35.61 kg/m.   Generalized: Well developed, in no acute distress  Cardiology: normal rate and rhythm, no murmur auscultated  Respiratory: clear to auscultation bilaterally    Neurological examination  Mentation: Alert oriented to time, place, history taking. Follows all commands speech mildly dysphonic, language fluent, moderate facial masking  Cranial nerve II-XII: Pupils were equal round reactive to light. Extraocular movements were full, visual field were full on confrontational test. Facial sensation and strength were normal. Uvula tongue midline. Head turning and shoulder shrug  were normal and symmetric. Motor: The motor testing reveals 4+ over 5 strength of all 4 extremities. Resting tremor noted of right upper and lower extremity as well as slight facial tremor.   Sensory: Sensory testing is intact to soft touch on all 4 extremities. No evidence of extinction is noted.  Coordination: Cerebellar testing reveals slow finger-nose-finger and reduced heel-to-shin bilaterally.  Gait and station: She slowly raises to standing position. Takes slow short steps. No assistive device used today. Gait fairly steady, decreased arm swing. Tandem and Romberg not assessed today due to safety concerns.  Reflexes: Deep tendon reflexes +1 in upper extremities and diminished in lowers.     DIAGNOSTIC DATA (LABS, IMAGING, TESTING) - I reviewed patient records, labs, notes, testing and imaging myself where available.  Lab Results  Component Value Date   WBC 6.8 08/31/2020   HGB 12.9 08/31/2020   HCT 41.1 08/31/2020   MCV 95.8 08/31/2020   PLT 275 08/31/2020  Component Value  Date/Time   NA 145 08/31/2020 0919   K 3.8 08/31/2020 0919   CL 107 08/31/2020 0919   CO2 28 08/31/2020 0919   GLUCOSE 122 (H) 08/31/2020 0919   BUN 24 (H) 08/31/2020 0919   CREATININE 1.36 (H) 08/31/2020 0919   CALCIUM 9.3 08/31/2020 0919   PROT 6.2 (L) 01/18/2020 1137   ALBUMIN 3.4 (L) 01/18/2020 1137   AST 14 (L) 01/18/2020 1137   ALT 14 01/18/2020 1137   ALKPHOS 93 01/18/2020 1137   BILITOT 0.9 01/18/2020 1137   GFRNONAA 39 (L) 08/31/2020 0919   GFRAA 57 (L) 01/19/2020 0513   No results found for: CHOL, HDL, LDLCALC, LDLDIRECT, TRIG, CHOLHDL No results found for: HGBA1C No results found for: VITAMINB12 No results found for: TSH  No flowsheet data found.   No flowsheet data found.   ASSESSMENT AND PLAN  80 y.o. year old female  has a past medical history of Cancer ([place]), Cataract, Colon polyp (2013), Common bile duct stone, Constipation, Family history of adverse reaction to anesthesia, GERD (gastroesophageal reflux disease), Gout, History of colon cancer (1980), Hyperlipemia, Hypertension, Hypothyroidism, Pneumonia, and Thyroid disease. here with    Parkinson's disease ([place])  [place] has had difficulty tolerating amantadine and carbidopa/levodopa. She does not wish to take these medications. She is having more difficulty resting at night due to right lower extremity tremor. We have discussed options including ropinirole versus referral to [facility] for consideration of DBS. She wishes to try ropinirole. We have discussed possible side effects and I have advised that she monitor very closely for any worsening dizziness or balance difficulty. We will start 0.25mg  at bedtime. She will monitor symptoms very closely and if well tolerated, may increase dose to 0.25mg  twice daily in 1-2 weeks. May consider TID dosing pending tolerability. She is able to repeat correct administration instructions back to me in the office. She does take more time to process and recall information,  however, orientation intact and able to provide relevant history. Will monitor closely for memory impairment.  She was advised to use extreme caution to prevent falls. I have encouraged her to use her walker at all times. Will consider referral to [facility] for consideration of procedural based intervention is she chooses. She will monitor blood pressures and follow up closely with PCP. She will return to see [place], alternating with Dr Rexene Alberts and myself, in 3-4 months. She verbalizes understanding and agreement with this plan.   No orders of the defined types were placed in this encounter.    Meds ordered this encounter  Medications  . rOPINIRole (REQUIP) 0.25 MG tablet    Sig: Take 1 tablet (0.25 mg total) by mouth in the morning and at bedtime. Take 1 tablet at bedtime for two weeks then increase to 1 tablet twice daily    Dispense:  60 tablet    Refill:  5    Order Specific Question:   Supervising Provider    Answer:   Melvenia Beam [[id number]]      I spent 35 minutes of face-to-face and non-face-to-face time with patient.  This included previsit chart review, lab review, study review, order entry, electronic health record documentation, patient education.    Debbora Presto, MSN, FNP-C 12/30/2020, 11:00 AM  [facility] [address] [place], [place] [address] [phone]  I reviewed the above note and documentation by the Nurse Practitioner and agree with the history, exam, assessment and plan as outlined above. I  was available for consultation. Star Age, MD, PhD [facility])

## 2020-12-30 ENCOUNTER — Encounter: Payer: Self-pay | Admitting: Family Medicine

## 2020-12-30 ENCOUNTER — Ambulatory Visit (INDEPENDENT_AMBULATORY_CARE_PROVIDER_SITE_OTHER): Payer: Medicare Other | Admitting: Family Medicine

## 2020-12-30 VITALS — BP 144/82 | HR 80 | Ht 63.0 in | Wt 201.0 lb

## 2020-12-30 DIAGNOSIS — G20 Parkinson's disease: Secondary | ICD-10-CM

## 2020-12-30 MED ORDER — ROPINIROLE HCL 0.25 MG PO TABS: 0.2500 mg | ORAL_TABLET | Freq: Two times a day (BID) | ORAL | 5 refills | Status: AC

## 2021-03-04 ENCOUNTER — Ambulatory Visit: Payer: Medicare Other | Admitting: Nurse Practitioner

## 2021-03-04 NOTE — Progress Notes (Deleted)
03/04/2021 CHALEE HIROTA [id number] [birth year]   CHIEF COMPLAINT:   HISTORY OF PRESENT ILLNESS:  Jacqueline Gamble is an 80 year old female with a past medical history of hypertension, hyperlipidemia, hypothyroidism, gout, Parkinson's disease, GERD colon polyps, colon cancer   She has a remote history of colon cancer. She underwent a surveillance colonoscopy by Dr. Fuller Plan on 01/08/2020 and a 2mm tubular adenomatous polyp was removed from the transverse colon. She developed rectal bleeding with the passage of bright red blood with clots and RLQ abdominal pain and she was admitted to the hospital on 01/18/2020. She underwent colonoscopy with Dr. Rush Landmark 01/19/2020 during her hospital admission which showed one 3 mm polyp in the descending colon which was removed as well as a single ulcer at the hepatic flexure consistent with her previous EMR site and proximal to the tattoo.  Clips were placed.  Also noted to have nonbleeding internal/external hemorrhoids and diverticulosis.  Colon cancer in her maternal grandfather; Colon polyps in her son Stomach cancer in her brother   She presents to our office today for further evaluation regarding black stools.   Colonoscopy 01/19/2020: - Hemorrhoids found on digital rectal exam. - Stool in the entire examined colon. Lavaged with adequate visualization. - One 3 mm tubular adenomatous polyp in the ascending colon, removed with a cold snare. Resected and retrieved. - A single (solitary) ulcer at the hepatic flexure consistent with previous EMR site and proximal to the tattoo. Clips (MR conditional) were placed. Though overall ability and procedure was technically challenging as a result of respiratory motion and elongated Right colon. - Diverticulosis in the entire examined colon. - Non-bleeding non-thrombosed external and internal hemorrhoid -Repeat surveillance colonoscopy in 6 to 12 months. -Consider using pediatric scope with next colonoscopy  for easier retroflexion of the right colon.  Colonoscopy 01/08/2020: - One 28 mm tubular adenomatous polyp in the transverse colon, removed piecemeal using a hot snare. Resected and retrieved. - Two sessile tubular adenomatous polyps in the ascending colon, removed by cold forceps. Resected and retrieved. - Tattoos were seen in the transverse colon. The tattoo site appeared abnormal with above 28 mm polyp on proximal end of tattoos. - Moderate diverticulosis in the left colon. - Patent end-to-end colo-colonic anastomosis, characterized by healthy appearing mucosa. - Internal hemorrhoids. - The examination was otherwise normal on direct and retroflexion views.  Colonoscopy 08/07/2018: - One 12 mm tubular adenomatous polyp in the transverse colon, removed with a hot snare. Resected and retrieved. - Three tattoos were seen in the transverse colon. A post-polypectomy scar was found on the proximal side at the tattoo site. - Two 3 to 5 mm, non-bleeding tubular adenomatous polyps in the ascending colon. Resected and retrieved. - Patent end-to-end colo-colonic anastomosis, characterized by healthy appearing mucosa. - Diverticulosis in the left colon. - Internal hemorrhoids. - The examination was otherwise normal on direct and retroflexion views.  Colonoscopy 01/18/2018: One 35 mm polyp in the transverse colon, removed piecemeal using a hot snare. Resected and retrieved. Tattooed in 3 places with 1 ml of spot just distal to the polypectomy site. - Four 7 to 8 mm polyps in the sigmoid colon, in the descending colon, in the transverse colon and in the cecum, removed with a cold snare. Resected and retrieved. - Diverticulosis in the descending colon. - Patent end-to-end colo-colonic anastomosis, characterized by healthy appearing mucosa. - Internal hemorrhoids. - The examination was otherwise normal on direct and retroflexion views. 1. Surgical [P], sigmoid, descending,  transverse, and cecum,  polyp (4) - TUBULAR ADENOMA (THREE). - SESSILE SERRATED POLYP WITHOUT DYSPLASIA (ONE). - NO HIGH GRADE DYSPLASIA OR MALIGNANCY. 2. Surgical [P], transverse, polyp - TUBULAR ADENOMA (NINE). - NO HIGH GRADE DYSPLASIA OR MALIGNANCY  EGD 09/25/2017 at [facility]: Esophagus was normal. A medium size hiatal hernia was present. The stomach was normal. The examined duodenum was normal. The area of minor papilla was normal.  No sign of residual polyp.  ERCP 07/28/2016: - Duodenal polyp vs prominent minor ampulla. Biopsied. Outpatient - Choledocholithiasis was found. Complete removal was accomplished by biliary sphincterotomy and balloon extraction. - A biliary sphincterotomy was performed. - The biliary tree was swept. - Prior cholecystectomy Duodenum, Biopsy - TUBULOVILLOUS ADENOMA. - HIGH GRADE DYSPLASIA IS NOT IDENTIFIED  EGD 04/30/2016 by Dr. Fuller Plan: - Normal esophagus. - Gastritis. Biopsied. - Small hiatal hernia. - Normal duodenal bulb and second portion of the duodenum.   CBC Latest Ref Rng & Units 08/31/2020 02/10/2020 01/19/2020  WBC 4.0 - 10.5 K/uL 6.8 5.9 6.1  Hemoglobin 12.0 - 15.0 g/dL 12.9 11.6(L) 10.5(L)  Hematocrit 36.0 - 46.0 % 41.1 34.9(L) 33.6(L)  Platelets 150 - 400 K/uL 275 281.0 259    CMP Latest Ref Rng & Units 08/31/2020 01/19/2020 01/18/2020  Glucose 70 - 99 mg/dL 122(H) 96 129(H)  BUN 8 - 23 mg/dL 24(H) 22 30(H)  Creatinine 0.44 - 1.00 mg/dL 1.36(H) 1.06(H) 1.29(H)  Sodium 135 - 145 mmol/L 145 143 143  Potassium 3.5 - 5.1 mmol/L 3.8 3.4(L) 3.2(L)  Chloride 98 - 111 mmol/L 107 111 107  CO2 22 - 32 mmol/L 28 24 26   Calcium 8.9 - 10.3 mg/dL 9.3 8.6(L) 8.8(L)  Total Protein 6.5 - 8.1 g/dL - - 6.2(L)  Total Bilirubin 0.3 - 1.2 mg/dL - - 0.9  Alkaline Phos 38 - 126 U/L - - 93  AST 15 - 41 U/L - - 14(L)  ALT 0 - 44 U/L - - 14     Past Medical History:  Diagnosis Date   Cancer ([place])    colon   Cataract    removed both eyes   Colon polyp 2013    TUBULAR ADENOMA   Common bile duct stone    Constipation    Family history of adverse reaction to anesthesia     " It takes a long time for my mother and brother to wake up."   GERD (gastroesophageal reflux disease)    Gout    History of colon cancer 1980   Hyperlipemia    Hypertension    Hypothyroidism    Pneumonia    Thyroid disease    Past Surgical History:  Procedure Laterality Date   ABDOMINAL HYSTERECTOMY     APPENDECTOMY     CHOLECYSTECTOMY N/A 11/12/2013   Procedure: LAPAROSCOPIC CHOLECYSTECTOMY WITH INTRAOPERATIVE CHOLANGIOGRAM;  Surgeon: Merrie Roof, MD;  Location: [facility] ORS;  Service: General;  Laterality: N/A;   COLON SURGERY      2 times/1980 and 1983   COLONOSCOPY  2013   COLONOSCOPY  01/08/2020   2019-TA   COLONOSCOPY WITH PROPOFOL N/A 01/19/2020   Procedure: COLONOSCOPY WITH PROPOFOL;  Surgeon: Irving Copas., MD;  Location: [facility];  Service: Gastroenterology;  Laterality: N/A;   ERCP N/A 07/28/2016   Procedure: ENDOSCOPIC RETROGRADE CHOLANGIOPANCREATOGRAPHY (ERCP);  Surgeon: Ladene Artist, MD;  Location[facility] END[facility];  Service: Endoscopy;  Laterality: N/A;   HEMOSTASIS CLIP PLACEMENT  01/19/2020   Procedure: HEMOSTASIS CLIP PLACEMENT;  Surgeon: Justice Britain  Brooke Bonito., MD;  Location: [facility];  Service: Gastroenterology;;   POLYPECTOMY     POLYPECTOMY  01/19/2020   Procedure: POLYPECTOMY;  Surgeon: Irving Copas., MD;  Location: [facility];  Service: Gastroenterology;;   TOTAL VAGINAL HYSTERECTOMY      Allergies  Allergen Reactions   Iodine Swelling   Codeine Itching and Rash      Outpatient Encounter Medications as of 03/04/2021  Medication Sig   albuterol (VENTOLIN HFA) 108 (90 Base) MCG/ACT inhaler Inhale 2 puffs into the lungs every 6 (six) hours as needed for wheezing or shortness of breath.   amLODipine (NORVASC) 10 MG tablet Take 1 tablet (10 mg total) by mouth daily.   aspirin 81 MG tablet Take 81 mg by mouth daily.    atenolol (TENORMIN) 50 MG tablet Take 50 mg by mouth daily.   dicyclomine (BENTYL) 10 MG capsule TAKE ONE CAPSULE BY MOUTH THREE TIMES A DAY BEFORE MEALS   irbesartan (AVAPRO) 150 MG tablet Take 150 mg by mouth daily.   levothyroxine (SYNTHROID, LEVOTHROID) 125 MCG tablet Take 62.5 mcg by mouth daily before breakfast.   pantoprazole (PROTONIX) 40 MG tablet Take 1 tablet (40 mg total) by mouth 2 (two) times daily.   rOPINIRole (REQUIP) 0.25 MG tablet Take 1 tablet (0.25 mg total) by mouth in the morning and at bedtime. Take 1 tablet at bedtime for two weeks then increase to 1 tablet twice daily   torsemide (DEMADEX) 20 MG tablet Take 20 mg by mouth daily.   Facility-Administered Encounter Medications as of 03/04/2021  Medication   0.9 %  sodium chloride infusion   REVIEW OF SYSTEMS:  Gen: Denies fever, sweats or chills. No weight loss.  CV: Denies chest pain, palpitations or edema. Resp: Denies cough, shortness of breath of hemoptysis.  GI: Denies heartburn, dysphagia, stomach or lower abdominal pain. No diarrhea or constipation.  GU : Denies urinary burning, blood in urine, increased urinary frequency or incontinence. MS: Denies joint pain, muscles aches or weakness. Derm: Denies rash, itchiness, skin lesions or unhealing ulcers. Psych: Denies depression, anxiety, memory loss, suicidal ideation and confusion. Heme: Denies bruising, bleeding. Neuro:  Denies headaches, dizziness or paresthesias. Endo:  Denies any problems with DM, thyroid or adrenal function.  PHYSICAL EXAM: There were no vitals taken for this visit. General: Well developed ... in no acute distress. Head: Normocephalic and atraumatic. Eyes:  Sclerae non-icteric, conjunctive pink. Ears: Normal auditory acuity. Mouth: Dentition intact. No ulcers or lesions.  Neck: Supple, no lymphadenopathy or thyromegaly.  Lungs: Clear bilaterally to auscultation without wheezes, crackles or rhonchi. Heart: Regular rate and rhythm.  No murmur, rub or gallop appreciated.  Abdomen: Soft, nontender, non distended. No masses. No hepatosplenomegaly. Normoactive bowel sounds x 4 quadrants.  Rectal:  Musculoskeletal: Symmetrical with no gross deformities. Skin: Warm and dry. No rash or lesions on visible extremities. Extremities: No edema. Neurological: Alert oriented x 4, no focal deficits.  Psychological:  Alert and cooperative. Normal mood and affect.  ASSESSMENT AND PLAN:  82. 80 year old female a history of GERD and a periampullary duodenal tubulovillous/tubular adenoma removed by EMR at [facility] in 09/2016. Follow-up EGD February 2019 at [facility] showed no residual polyp. She presents today with reports of melenic stool.   -EGD -CBC, iron, iron saturation, TIBC, Ferritin and CMP    2. History of colon cancer, tubular adenomatous colon polyps. Colonoscopy 12/2019 identified a 54mm tubular adenomatous polyp removed from the transverse colon which resulted in a post polypectomy lower  GI bleed which required hospital admission. A repeat colonoscopy was done on 01/19/2020 and four clips were placed at the polypectomy site with stigmata of recent bleeding. No residual hepatic flexure polyp was noted.  A 3 mm ascending colon polyp was removed. A repeat colonoscopy in 6 months as recommended.  -Colonoscopy     3. History of choledocholithiasis status post ERCP sphincterotomy, stone extraction, sludge extraction 07/28/2016  4. Parkinson's Disease       CC:  Galen Manila, MD

## 2021-03-15 ENCOUNTER — Encounter: Payer: Medicare Other | Admitting: Gastroenterology

## 2021-05-04 ENCOUNTER — Ambulatory Visit: Payer: Medicare Other | Admitting: Neurology

## 2021-07-05 ENCOUNTER — Encounter: Payer: Self-pay | Admitting: Neurology

## 2021-07-05 ENCOUNTER — Ambulatory Visit: Payer: Medicare Other | Admitting: Neurology

## 2021-11-22 ENCOUNTER — Ambulatory Visit (INDEPENDENT_AMBULATORY_CARE_PROVIDER_SITE_OTHER): Payer: Medicare Other | Admitting: Gastroenterology

## 2021-11-22 ENCOUNTER — Encounter: Payer: Self-pay | Admitting: Gastroenterology

## 2021-11-22 VITALS — BP 160/90 | HR 82 | Ht 63.0 in | Wt 191.2 lb

## 2021-11-22 DIAGNOSIS — Z86010 Personal history of colonic polyps: Secondary | ICD-10-CM | POA: Diagnosis not present

## 2021-11-22 DIAGNOSIS — R198 Other specified symptoms and signs involving the digestive system and abdomen: Secondary | ICD-10-CM | POA: Diagnosis not present

## 2021-11-22 DIAGNOSIS — Z85038 Personal history of other malignant neoplasm of large intestine: Secondary | ICD-10-CM

## 2021-11-22 DIAGNOSIS — R112 Nausea with vomiting, unspecified: Secondary | ICD-10-CM

## 2021-11-22 DIAGNOSIS — R159 Full incontinence of feces: Secondary | ICD-10-CM

## 2021-11-22 DIAGNOSIS — K219 Gastro-esophageal reflux disease without esophagitis: Secondary | ICD-10-CM

## 2021-11-22 DIAGNOSIS — K5900 Constipation, unspecified: Secondary | ICD-10-CM

## 2021-11-22 MED ORDER — PANTOPRAZOLE SODIUM 40 MG PO TBEC: 40.0000 mg | DELAYED_RELEASE_TABLET | Freq: Every day | ORAL | 11 refills | Status: AC

## 2021-11-22 MED ORDER — ONDANSETRON HCL 4 MG PO TABS: ORAL_TABLET | ORAL | 0 refills | Status: AC

## 2021-11-22 MED ORDER — PEG-KCL-NACL-NASULF-NA ASC-C 100 G PO SOLR: 1.0000 | Freq: Once | ORAL | 0 refills | Status: AC

## 2021-11-22 NOTE — Patient Instructions (Signed)
Start over the counter Miralax mixing 17 grams in 8 oz of water daily.  ? ?We have sent the following medications to your pharmacy for you to pick up at your convenience: pantoprazole 40 mg daily and zofran 4 mg to take one hour prior to each prep dose.  ? ?Patient advised to avoid spicy, acidic, citrus, chocolate, mints, fruit and fruit juices.  Limit the intake of caffeine, alcohol and Soda.  Don't exercise too soon after eating.  Don't lie down within 3-4 hours of eating.  Elevate the head of your bed. ? ?You have been scheduled for a colonoscopy. Please follow written instructions given to you at your visit today.  ?Please pick up your prep supplies at the pharmacy within the next 1-3 days. ?If you use inhalers (even only as needed), please bring them with you on the day of your procedure. ? ?The [facility] providers would like to encourage you to use [facility] to communicate with providers for non-urgent requests or questions.  Due to long hold times on the telephone, sending your provider a message by [facility] may be a faster and more efficient way to get a response.  Please allow 48 business hours for a response.  Please remember that this is for non-urgent requests.  ? ?Due to recent changes in healthcare laws, you may see the results of your imaging and laboratory studies on MyChart before your provider has had a chance to review them.  We understand that in some cases there may be results that are confusing or concerning to you. Not all laboratory results come back in the same time frame and the provider may be waiting for multiple results in order to interpret others.  Please give [place] 48 hours in order for your provider to thoroughly review all the results before contacting the office for clarification of your results.  ? ?Thank you for choosing me and [facility]. ? ?Malcolm T. Dagoberto Ligas., MD., Marval Regal ? ? ?

## 2021-11-22 NOTE — Progress Notes (Signed)
? ? ?  History of Present Illness: This is an 81 year old female with a personal history of colon polyps and personal history of colon cancer.  She is accompanied by her daughter.  She underwent colonoscopy in May 2021 with a 2.8 cm tubular adenomatous colon polyp removed piecemeal.  This was complicated by post polypectomy bleed and she underwent a repeat colonoscopy 11 days later with clips placed at the ulcer, prior polypectomy site.  She relates problems with constipation alternating with diarrhea.  She states she generally has a bowel movement every 2 to 3 days and after the bowel movement she will often have looser stool with occasional incontinence.  She relates frequent problems with heartburn, regurgitation, postprandial nausea and vomiting.  EGD performed in February 2019 at [facility] showed a medium sized hiatal hernia with no sign of residual duodenal polyp. ? ?Colonoscopy 12/2019 ?- One 28 mm polyp in the transverse colon, removed piecemeal using a hot snare. Resected ?and retrieved. ?- Two sessile polyps in the ascending colon, removed by cold forceps. Resected and retrieved. ?- Tattoos were seen in the transverse colon. The tattoo site appeared abnormal with above 28 mm polyp on proximal end of tattoos. ?- Moderate diverticulosis in the left colon. ?- Patent end-to-end colo-colonic anastomosis, characterized by healthy appearing mucosa. ?- Internal hemorrhoids. ?- The examination was otherwise normal on direct and retroflexion views. ? ?Current Medications, Allergies, Past Medical History, Past Surgical History, Family History and Social History were reviewed in [employer] record. ? ? ?Physical Exam: ?General: Well developed, well nourished, uses a walker, no acute distress ?Head: Normocephalic and atraumatic ?Eyes: Sclerae anicteric, EOMI ?Ears: Normal auditory acuity ?Mouth: Not examined, mask on during Covid-19 pandemic ?Lungs: Clear throughout to auscultation ?Heart: Regular rate  and rhythm; no murmurs, rubs or bruits ?Abdomen: Soft, non tender and non distended. No masses, hepatosplenomegaly or hernias noted. Normal Bowel sounds ?Rectal: Deferred to colonoscopy  ?Musculoskeletal: Symmetrical with no gross deformities  ?Pulses:  Normal pulses noted ?Extremities: No clubbing, cyanosis, edema or deformities noted ?Neurological: Alert oriented x 4, resting tremor, unsteady gait ?Psychological:  Alert and cooperative. Normal mood and affect ? ? ?Assessment and Recommendations: ? ?Piecemeal polypectomy of a 2.8 cm tubular adenomatous colon polyp in May 2021.  Personal history of colon cancer status post sigmoid colectomy.  She is high risk for complications with colonoscopy and anesthesia. The risks (including bleeding, perforation, infection, missed lesions, medication reactions and possible hospitalization or surgery if complications occur), benefits, and alternatives to colonoscopy with possible biopsy and possible polypectomy were discussed with the patient and they consent to proceed.  Patient requests MoviPrep due to difficulties with nausea and vomiting caused by other bowel preps.  Zofran 4 mg before each prep dose. ?GERD, medium sized hiatal hernia, associated with regurgitation and intermittent nausea and vomiting. Follow antireflux measures long-term. Start pantoprazole 40 mg po q am for long-term use. If symptoms are not well controlled over the next 6 to 8 weeks consider increasing pantoprazole to twice daily and/or adding famotidine at bedtime.  Consider further evaluation if these measures are not adequately controlling her symptoms. ?Constipation alternating with diarrhea and occasional fecal incontinence.  She may be experiencing some overflow diarrhea symptoms.  Begin Kegel exercises 5 times daily long-term.  Begin MiraLAX 1/2-2 scoops daily titrated for complete bowel movement daily.  She is advised to use MiraLAX daily for the 5 days leading up to colonoscopy. ?

## 2021-12-11 IMAGING — CT CT ABD-PELV W/O CM
2 of 4 series · 15 of 46 positions shown, 17 images · non-contrast
Comparison: CT of the abdomen pelvis dated 04/28/2016.

CLINICAL DATA: 78-year-old female with lower abdominal pain.

EXAM:
CT ABDOMEN AND PELVIS WITHOUT CONTRAST
TECHNIQUE: Multidetector CT imaging of the abdomen and pelvis was performed
following the standard protocol without IV contrast.

[Series 2: axial st · axial · 0.91mm/px · z∈[-396,-11]mm · 12 of 87 slices shown, 14 images]
[im 5/87  soft-tissue]
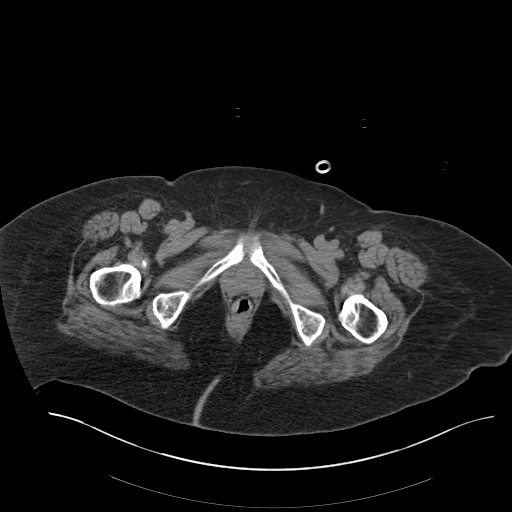
[im 5/87  bone]
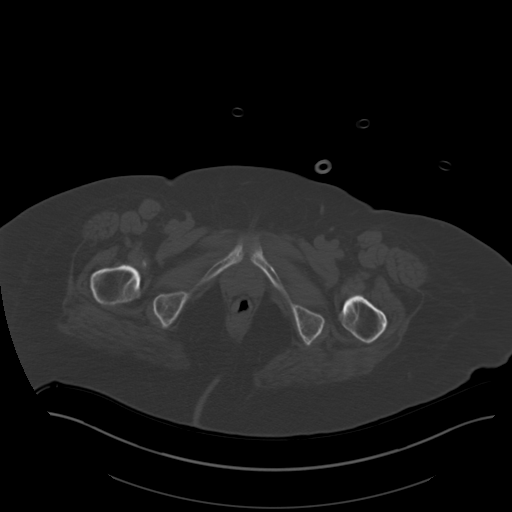
[im 15/87  soft-tissue]
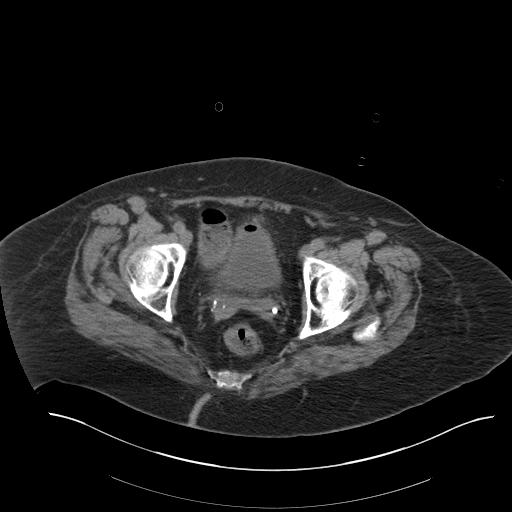
[im 20/87  soft-tissue]
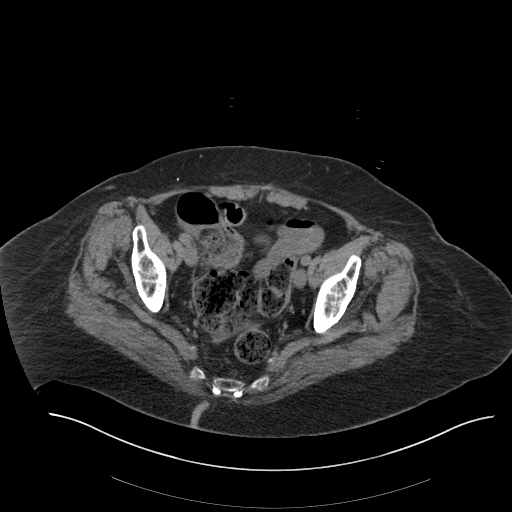
[im 24/87  soft-tissue]
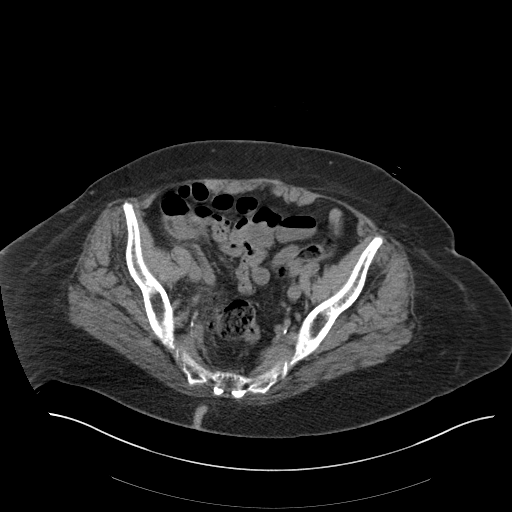
[im 34/87  soft-tissue]
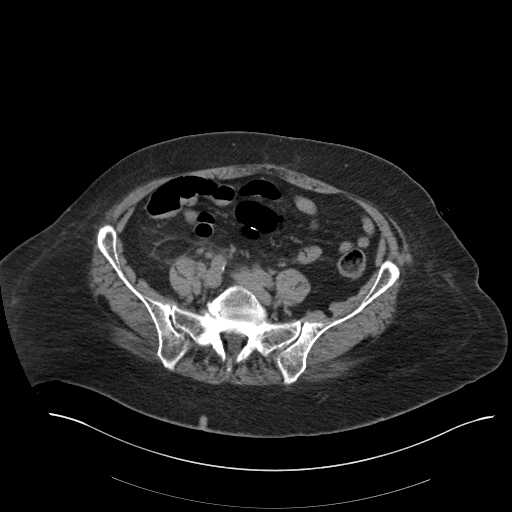
[im 39/87  soft-tissue]
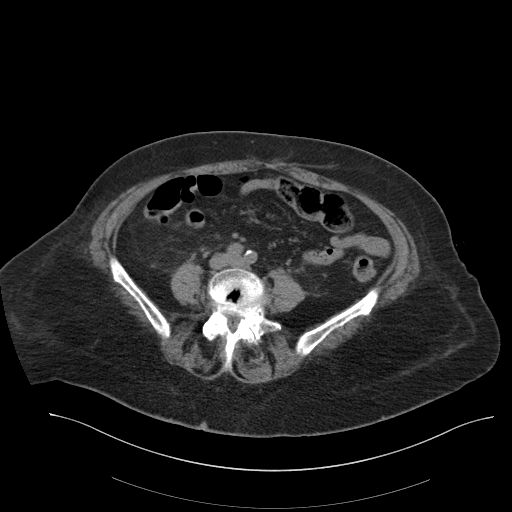
[im 48/87  soft-tissue]
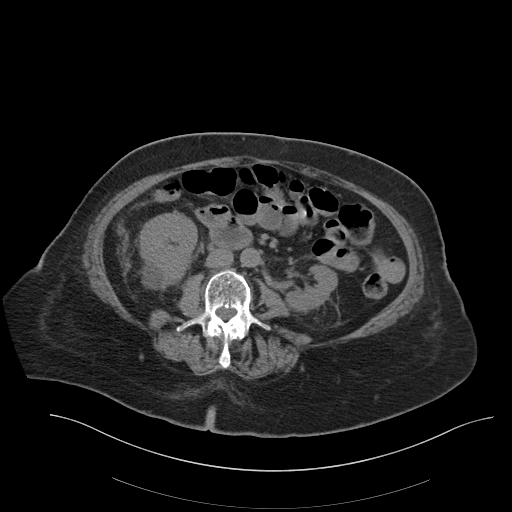
[im 53/87  soft-tissue]
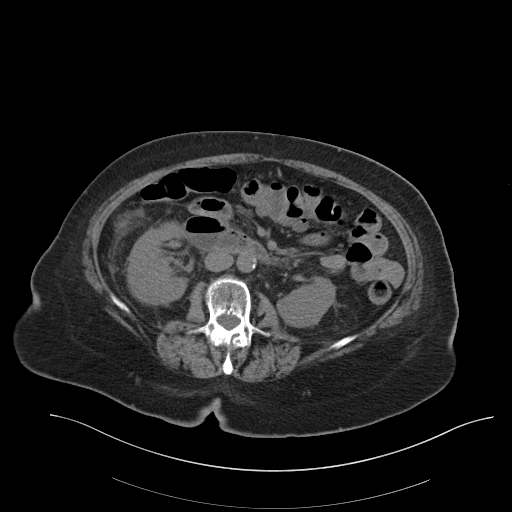
[im 63/87  soft-tissue]
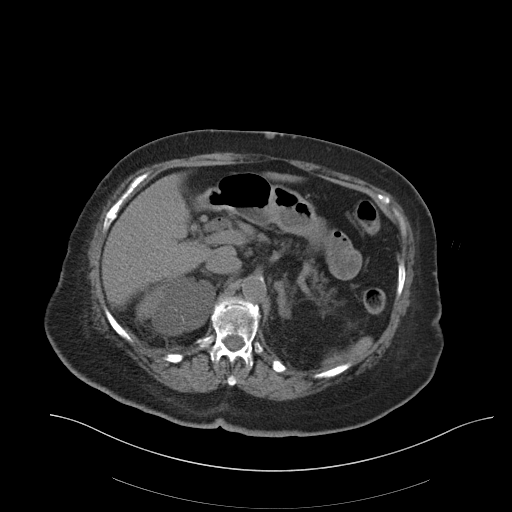
[im 63/87  bone]
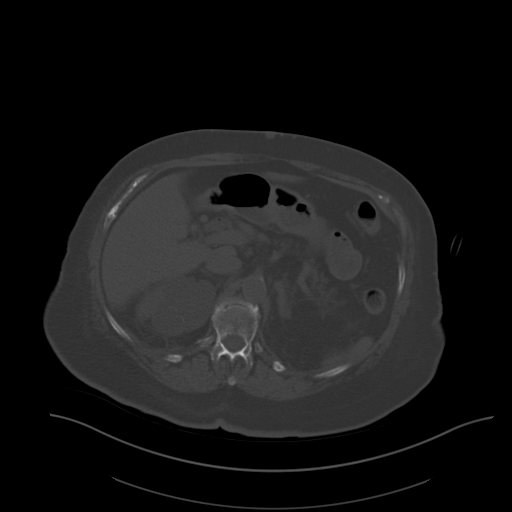
[im 67/87  soft-tissue]
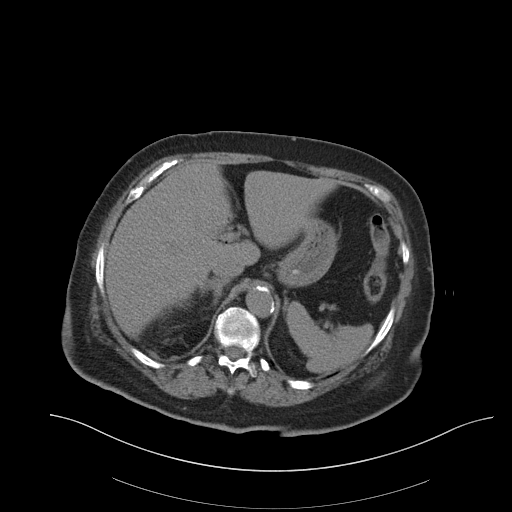
[im 72/87  soft-tissue]
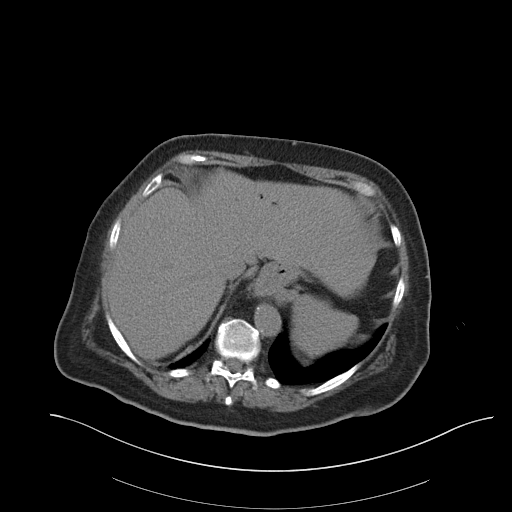
[im 82/87  soft-tissue]
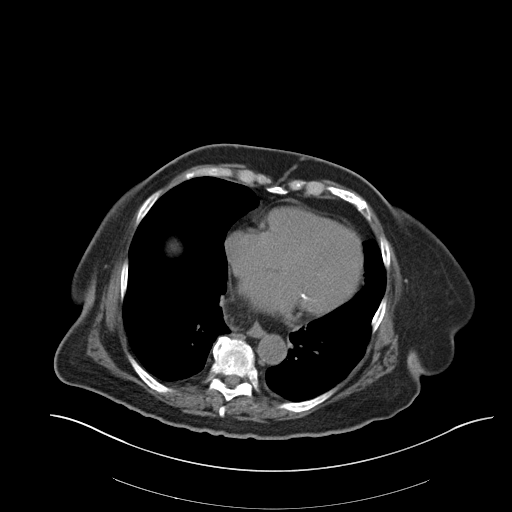

[Series 5: coronal st · coronal · 0.81mm/px · 3 of 141 slices shown]
[im 47/141  soft-tissue]
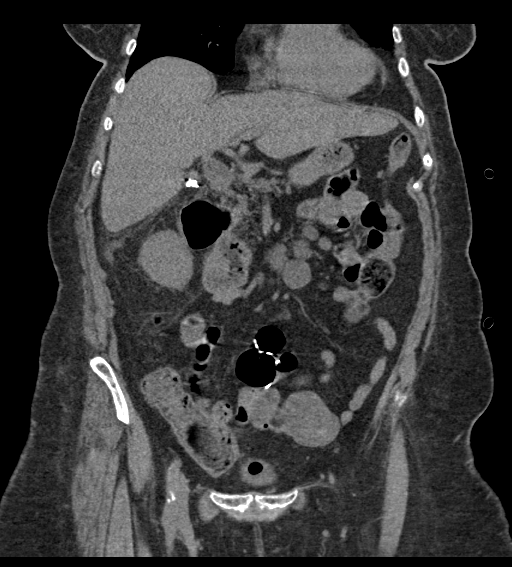
[im 63/141  soft-tissue]
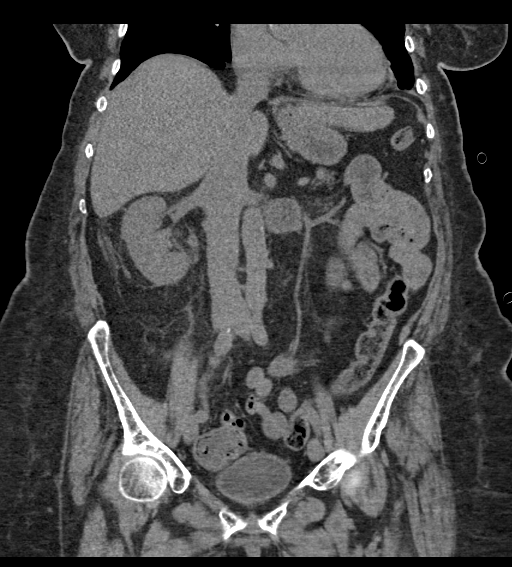
[im 78/141  soft-tissue]
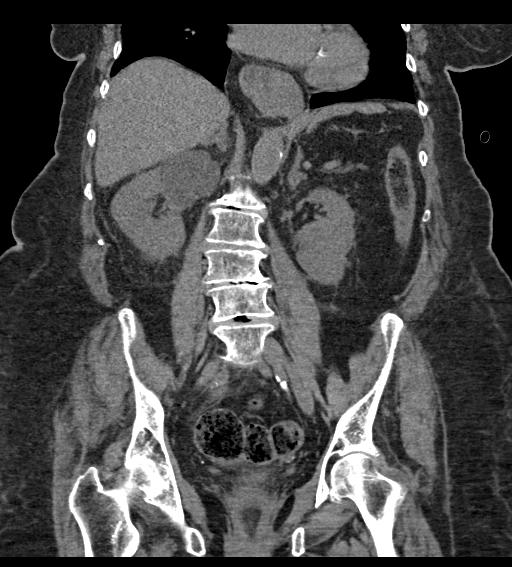

[15 of 46 positions shown; findings below may reference images not displayed]

FINDINGS: Evaluation of this exam is limited in the absence of intravenous
contrast.

Lower chest: The visualized lung bases are clear. Coronary vascular
calcification noted.

No intra-abdominal free air or free fluid.

Hepatobiliary: Subcentimeter hypodense focus in the left lobe of the
liver is not well characterized. The liver is otherwise
unremarkable. Pneumobilia noted primarily in the left intrahepatic
biliary trees, new since the prior CT. Cholecystectomy.

Pancreas: Unremarkable. No pancreatic ductal dilatation or
surrounding inflammatory changes.

Spleen: Normal in size without focal abnormality.

Adrenals/Urinary Tract: The adrenal glands are unremarkable. There
is a 5.2 x 6.5 cm lobulated, probable multi septated cyst in the
upper pole of the right kidney with thin linear calcification. A
cm inferior pole cyst is also noted. These can be better evaluated
with ultrasound on a nonemergent basis. There is no hydronephrosis
or nephrolithiasis on either side. Bilateral perinephric stranding,
nonspecific. Correlation with urinalysis recommended to evaluate for
pyelonephritis. The visualized ureters appear unremarkable. There is
a small pocket of air within the urinary bladder which may be
related to recent instrumentation or infection.

Stomach/Bowel: There is a small hiatal hernia. There is a 5.5 cm
duodenal diverticulum. Mild haziness and stranding of the fat
surrounding the second portion of the duodenum noted which may
represent mild duodenitis or mild inflammatory changes of the
duodenal diverticula. There is no bowel obstruction. Appendectomy.

Vascular/Lymphatic: Mild aortoiliac atherosclerotic disease. The IVC
is unremarkable. No portal venous gas.

Reproductive: Hysterectomy.

Other: None

Musculoskeletal: Degenerative changes of the spine with multilevel
disc desiccation and vacuum phenomena. No acute osseous pathology.
Grade 1 L4-L5 anterolisthesis.
IMPRESSION: 1. A 5.5 cm duodenal diverticulum. Mild haziness and stranding of
the fat surrounding the second portion of the duodenum may represent
mild duodenitis or mild inflammatory changes of the duodenal
diverticula. No bowel obstruction.
2. Pneumobilia, new since the prior CT. Correlation with clinical
exam and liver function tests recommended.
3. Bilateral renal cysts as described. These can be better evaluated
with ultrasound on a nonemergent basis.
4. Bilateral perinephric stranding. Correlation with urinalysis
recommended to exclude cystitis.
5. Aortic Atherosclerosis (0ADC1-JNZ.Z).

## 2022-01-03 ENCOUNTER — Encounter: Payer: Self-pay | Admitting: Gastroenterology

## 2022-01-03 ENCOUNTER — Ambulatory Visit (AMBULATORY_SURGERY_CENTER): Payer: Medicare Other | Admitting: Gastroenterology

## 2022-01-03 VITALS — BP 127/67 | HR 71 | Temp 96.9°F | Resp 14 | Ht 63.0 in | Wt 191.0 lb

## 2022-01-03 DIAGNOSIS — Z86010 Personal history of colonic polyps: Secondary | ICD-10-CM | POA: Diagnosis not present

## 2022-01-03 DIAGNOSIS — D125 Benign neoplasm of sigmoid colon: Secondary | ICD-10-CM | POA: Diagnosis not present

## 2022-01-03 DIAGNOSIS — Z85038 Personal history of other malignant neoplasm of large intestine: Secondary | ICD-10-CM

## 2022-01-03 MED ORDER — SODIUM CHLORIDE 0.9 % IV SOLN: 500.0000 mL | Freq: Once | INTRAVENOUS | Status: DC

## 2022-01-03 NOTE — Progress Notes (Signed)
Called to room to assist during endoscopic procedure.  Patient ID and intended procedure confirmed with present staff. Received instructions for my participation in the procedure from the performing physician.  

## 2022-01-03 NOTE — Patient Instructions (Signed)
Handouts on polyps, hemorrhoids, and diverticulosis given to patient. ?Await pathology results. ?No repeat colonoscopy for surveillance due to age unless new symptoms arise. ? ? ?YOU HAD AN ENDOSCOPIC PROCEDURE TODAY AT [facility]:   Refer to the procedure report that was given to you for any specific questions about what was found during the examination.  If the procedure report does not answer your questions, please call your gastroenterologist to clarify.  If you requested that your care partner not be given the details of your procedure findings, then the procedure report has been included in a sealed envelope for you to review at your convenience later. ? ?YOU SHOULD EXPECT: Some feelings of bloating in the abdomen. Passage of more gas than usual.  Walking can help get rid of the air that was put into your GI tract during the procedure and reduce the bloating. If you had a lower endoscopy (such as a colonoscopy or flexible sigmoidoscopy) you may notice spotting of blood in your stool or on the toilet paper. If you underwent a bowel prep for your procedure, you may not have a normal bowel movement for a few days. ? ?Please Note:  You might notice some irritation and congestion in your nose or some drainage.  This is from the oxygen used during your procedure.  There is no need for concern and it should clear up in a day or so. ? ?SYMPTOMS TO REPORT IMMEDIATELY: ? ?Following lower endoscopy (colonoscopy or flexible sigmoidoscopy): ? Excessive amounts of blood in the stool ? Significant tenderness or worsening of abdominal pains ? Swelling of the abdomen that is new, acute ? Fever of 100?F or higher ? ?For urgent or emergent issues, a gastroenterologist can be reached at any hour by calling [phone]. ?Do not use MyChart messaging for urgent concerns.  ? ? ?DIET:  We do recommend a small meal at first, but then you may proceed to your regular diet.  Drink plenty of fluids but you should  avoid alcoholic beverages for 24 hours. ? ?ACTIVITY:  You should plan to take it easy for the rest of today and you should NOT DRIVE or use heavy machinery until tomorrow (because of the sedation medicines used during the test).   ? ?FOLLOW UP: ?Our staff will call the number listed on your records 48-72 hours following your procedure to check on you and address any questions or concerns that you may have regarding the information given to you following your procedure. If we do not reach you, we will leave a message.  We will attempt to reach you two times.  During this call, we will ask if you have developed any symptoms of COVID 19. If you develop any symptoms (ie: fever, flu-like symptoms, shortness of breath, cough etc.) before then, please call [phone].  If you test positive for Covid 19 in the 2 weeks post procedure, please call and report this information to [place].   ? ?If any biopsies were taken you will be contacted by phone or by letter within the next 1-3 weeks.  Please call us at [phone] if you have not heard about the biopsies in 3 weeks.  ? ? ?SIGNATURES/CONFIDENTIALITY: ?You and/or your care partner have signed paperwork which will be entered into your electronic medical record.  These signatures attest to the fact that that the information above on your After Visit Summary has been reviewed and is understood.  Full responsibility of the confidentiality of this discharge information  lies with you and/or your care-partner.  ?

## 2022-01-03 NOTE — Progress Notes (Signed)
Pt's states no medical or surgical changes since previsit or office visit. 

## 2022-01-03 NOTE — Progress Notes (Signed)
History of Present Illness: This is an 81 year old female with a personal history of colon polyps and personal history of colon cancer.  She is accompanied by her daughter.  She underwent colonoscopy in May 2021 with a 2.8 cm tubular adenomatous colon polyp removed piecemeal.  This was complicated by post polypectomy bleed and she underwent a repeat colonoscopy 11 days later with clips placed at the ulcer, prior polypectomy site.  She relates problems with constipation alternating with diarrhea.  She states she generally has a bowel movement every 2 to 3 days and after the bowel movement she will often have looser stool with occasional incontinence.  She relates frequent problems with heartburn, regurgitation, postprandial nausea and vomiting.  EGD performed in February 2019 at [facility] showed a medium sized hiatal hernia with no sign of residual duodenal polyp. ?  ?Colonoscopy 12/2019 ?- One 28 mm polyp in the transverse colon, removed piecemeal using a hot snare. Resected ?and retrieved. ?- Two sessile polyps in the ascending colon, removed by cold forceps. Resected and retrieved. ?- Tattoos were seen in the transverse colon. The tattoo site appeared abnormal with above 28 mm polyp on proximal end of tattoos. ?- Moderate diverticulosis in the left colon. ?- Patent end-to-end colo-colonic anastomosis, characterized by healthy appearing mucosa. ?- Internal hemorrhoids. ?- The examination was otherwise normal on direct and retroflexion views. ?  ?Current Medications, Allergies, Past Medical History, Past Surgical History, Family History and Social History were reviewed in [employer] record. ?  ?  ?Physical Exam: ?General: Well developed, well nourished, uses a walker, no acute distress ?Head: Normocephalic and atraumatic ?Eyes: Sclerae anicteric, EOMI ?Ears: Normal auditory acuity ?Mouth: Not examined, mask on during Covid-19 pandemic ?Lungs: Clear throughout to auscultation ?Heart: Regular rate  and rhythm; no murmurs, rubs or bruits ?Abdomen: Soft, non tender and non distended. No masses, hepatosplenomegaly or hernias noted. Normal Bowel sounds ?Rectal: Deferred to colonoscopy  ?Musculoskeletal: Symmetrical with no gross deformities  ?Pulses:  Normal pulses noted ?Extremities: No clubbing, cyanosis, edema or deformities noted ?Neurological: Alert oriented x 4, resting tremor, unsteady gait ?Psychological:  Alert and cooperative. Normal mood and affect ?  ?  ?Assessment and Recommendations: ?  ?Piecemeal polypectomy of a 2.8 cm tubular adenomatous colon polyp in May 2021.  Personal history of colon cancer status post sigmoid colectomy.  She is high risk for complications with colonoscopy and anesthesia. The risks (including bleeding, perforation, infection, missed lesions, medication reactions and possible hospitalization or surgery if complications occur), benefits, and alternatives to colonoscopy with possible biopsy and possible polypectomy were discussed with the patient and they consent to proceed.  Patient requests MoviPrep due to difficulties with nausea and vomiting caused by other bowel preps.  Zofran 4 mg before each prep dose. ?GERD, medium sized hiatal hernia, associated with regurgitation and intermittent nausea and vomiting. Follow antireflux measures long-term. Start pantoprazole 40 mg po q am for long-term use. If symptoms are not well controlled over the next 6 to 8 weeks consider increasing pantoprazole to twice daily and/or adding famotidine at bedtime.  Consider further evaluation if these measures are not adequately controlling her symptoms. ?Constipation alternating with diarrhea and occasional fecal incontinence.  She may be experiencing some overflow diarrhea symptoms.  Begin Kegel exercises 5 times daily long-term.  Begin MiraLAX 1/2-2 scoops daily titrated for complete bowel movement daily.  She is advised to use MiraLAX daily for the 5 days leading up to colonoscopy. ?

## 2022-01-03 NOTE — Progress Notes (Signed)
To pacu, VSS. Report to [email or web address] 

## 2022-01-03 NOTE — Op Note (Addendum)
[facility] ?Patient Name: Jacqueline Gamble ?Procedure Date: 01/03/2022 1:59 PM ?MRN: [id number] ?Endoscopist: Ladene Artist , MD ?Age: 81 ?Referring MD:  ?Date of Birth: [birth year] ?Gender: Female ?Account #: [id number] ?Procedure:                Colonoscopy ?Indications:              Surveillance: Piecemeal removal of large sessile  ?                          adenoma last colonoscopy (< 3 yrs). Personal  ?                          history of colon cancer. ?Medicines:                Monitored Anesthesia Care ?Procedure:                Pre-Anesthesia Assessment: ?                          - Prior to the procedure, a History and Physical  ?                          was performed, and patient medications and  ?                          allergies were reviewed. The patient's tolerance of  ?                          previous anesthesia was also reviewed. The risks  ?                          and benefits of the procedure and the sedation  ?                          options and risks were discussed with the patient.  ?                          All questions were answered, and informed consent  ?                          was obtained. Prior Anticoagulants: The patient has  ?                          taken no previous anticoagulant or antiplatelet  ?                          agents. ASA Grade Assessment: III - A patient with  ?                          severe systemic disease. After reviewing the risks  ?                          and benefits, the patient was deemed in  ?  satisfactory condition to undergo the procedure. ?                          After obtaining informed consent, the colonoscope  ?                          was passed under direct vision. Throughout the  ?                          procedure, the patient's blood pressure, pulse, and  ?                          oxygen saturations were monitored continuously. The  ?                          CF [id number] #[id number] was introduced  through the anus  ?                          and advanced to the the cecum, identified by  ?                          appendiceal orifice and ileocecal valve. The  ?                          ileocecal valve, appendiceal orifice, and rectum  ?                          were photographed. The quality of the bowel  ?                          preparation was adequate after extensive lavage,  ?                          suction. The colonoscopy was performed without  ?                          difficulty. The patient tolerated the procedure  ?                          well. ?Scope In: 2:11:07 PM ?Scope Out: 2:32:33 PM ?Scope Withdrawal Time: 0 hours 16 minutes 37 seconds  ?Total Procedure Duration: 0 hours 21 minutes 26 seconds  ?Findings:                 The perianal and digital rectal examinations were  ?                          normal. ?                          A tattoo was seen in the transverse colon. The  ?                          tattoo site appeared normal. ?  A 7 mm polyp was found in the sigmoid colon. The  ?                          polyp was sessile. The polyp was removed with a  ?                          cold snare. Resection and retrieval were complete. ?                          A few small-mouthed diverticula were found in the  ?                          right colon. There was no evidence of diverticular  ?                          bleeding. ?                          Multiple medium-mouthed diverticula were found in  ?                          the left colon. There was narrowing of the colon in  ?                          association with the diverticular opening. There  ?                          was evidence of diverticular spasm. There was no  ?                          evidence of diverticular bleeding. ?                          There was evidence of a prior end-to-end  ?                          colo-colonic anastomosis in the sigmoid colon. This  ?                           was patent and was characterized by healthy  ?                          appearing mucosa. The anastomosis was traversed. ?                          Internal hemorrhoids were found during  ?                          retroflexion. The hemorrhoids were small and Grade  ?                          I (internal hemorrhoids that do not prolapse). ?  The exam was otherwise without abnormality on  ?                          direct and retroflexion views. ?Complications:            No immediate complications. Estimated blood loss:  ?                          None. ?Estimated Blood Loss:     Estimated blood loss: none. ?Impression:               - A tattoo was seen in the transverse colon. The  ?                          tattoo site appeared normal. ?                          - One 7 mm polyp in the sigmoid colon, removed with  ?                          a cold snare. Resected and retrieved. ?                          - Mild diverticulosis in the right colon. ?                          - Moderate diverticulosis in the left colon. ?                          - Patent end-to-end colo-colonic anastomosis,  ?                          characterized by healthy appearing mucosa. ?                          - Internal hemorrhoids. ?                          - The examination was otherwise normal on direct  ?                          and retroflexion views. ?Recommendation:           - Patient has a contact number available for  ?                          emergencies. The signs and symptoms of potential  ?                          delayed complications were discussed with the  ?                          patient. Return to normal activities tomorrow.  ?                          Written discharge instructions were provided to the  ?  patient. ?                          - Resume previous diet. ?                          - Continue present medications. ?                          - Await  pathology results. ?                          - No repeat colonoscopy due to age. ?Ladene Artist, MD ?01/03/2022 2:38:24 PM ?This report has been signed electronically. ?

## 2022-01-05 ENCOUNTER — Telehealth: Payer: Self-pay | Admitting: *Deleted

## 2022-01-05 NOTE — Telephone Encounter (Signed)
?  Follow up Call- ? ? ?  01/03/2022  ?  1:27 PM 01/08/2020  ? 10:41 AM  ?Call back number  ?Post procedure Call Back phone  # [phone]  ?Permission to leave phone message Yes Yes  ?  ? ?Patient questions: ? ?Do you have a fever, pain , or abdominal swelling? No. ?Pain Score  0 * ? ?Have you tolerated food without any problems? Yes.   ? ?Have you been able to return to your normal activities? Yes.   ? ?Do you have any questions about your discharge instructions: ?Diet   No. ?Medications  No. ?Follow up visit  No. ? ?Do you have questions or concerns about your Care? No. ? ?Actions: ?* If pain score is 4 or above: ?No action needed, pain <4. ? ? ?

## 2022-01-17 ENCOUNTER — Encounter: Payer: Self-pay | Admitting: Gastroenterology

## 2023-02-12 IMAGING — US US RENAL
1 series · 14 of 25 positions shown · non-contrast
Comparison: CT 08/20/2019. Renal ultrasound 10/02/2019. MRI
10/18/2019.

CLINICAL DATA: Right renal cyst

EXAM:
RENAL / URINARY TRACT ULTRASOUND COMPLETE

[Series 1: us renal · 0.25mm/px · 106 acquisitions, 14 frames shown]
[im 1/106]
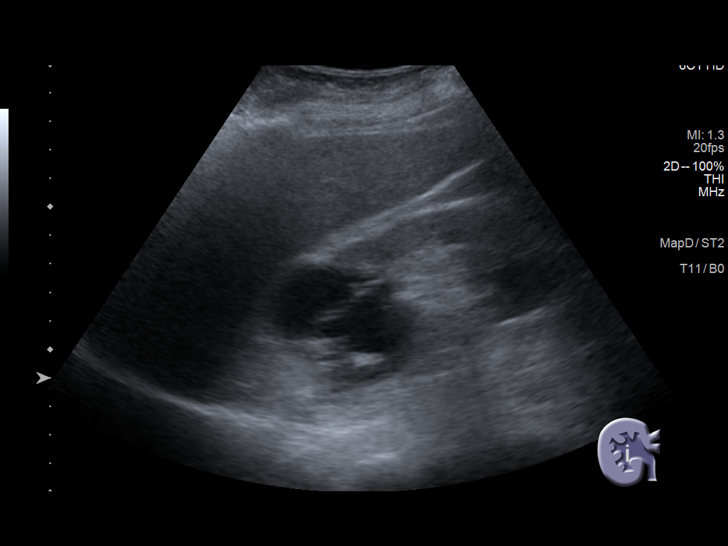
[im 9/106]
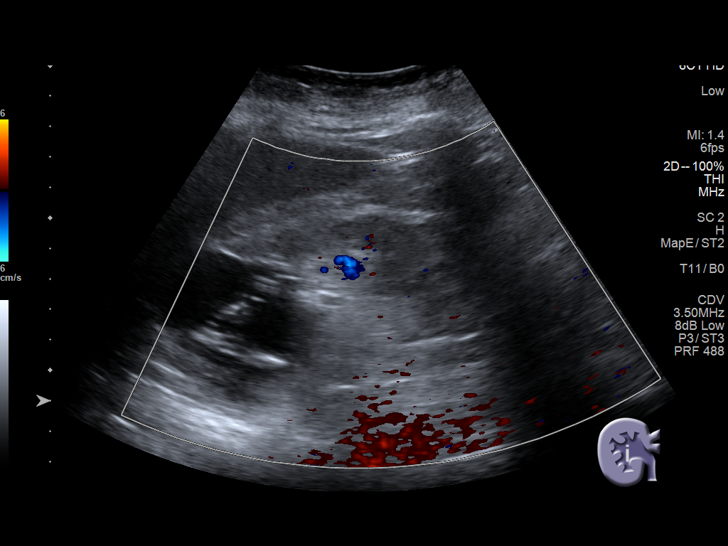
[im 18/106]
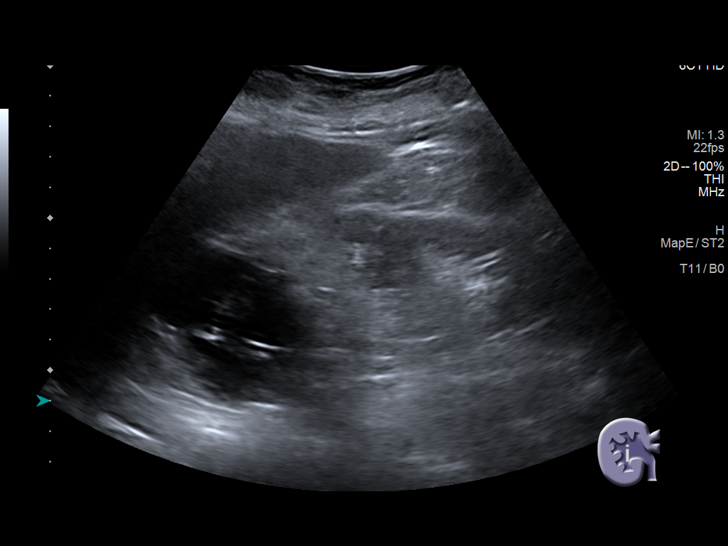
[im 27/106]
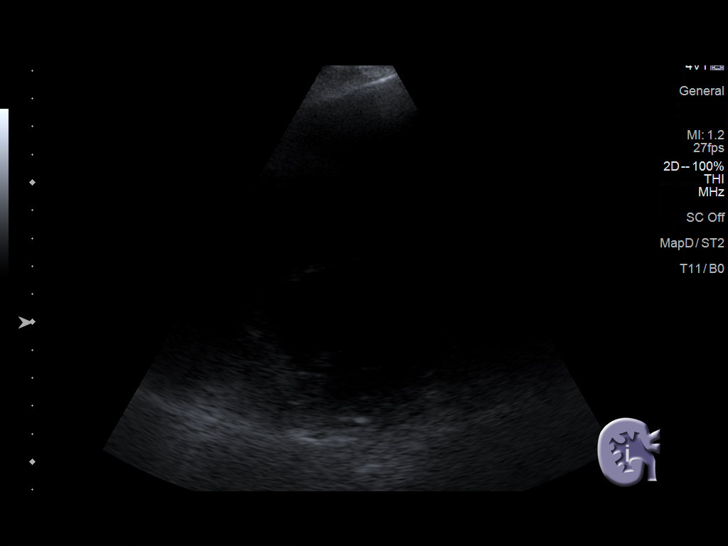
[im 36/106]
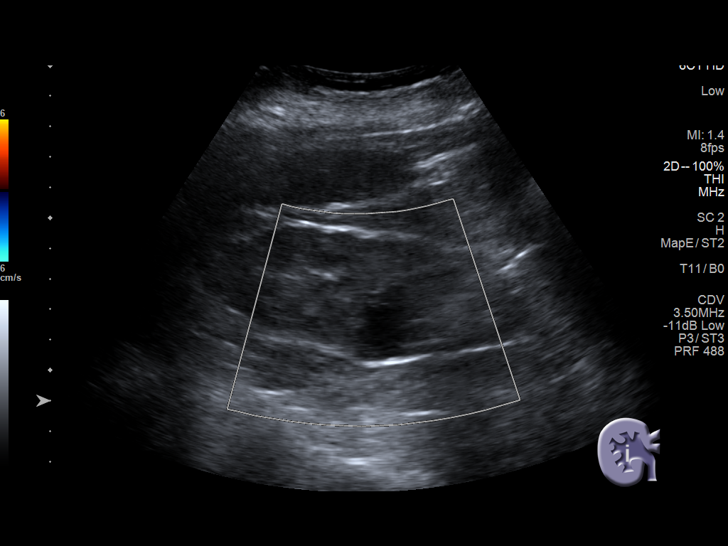
[im 40/106]
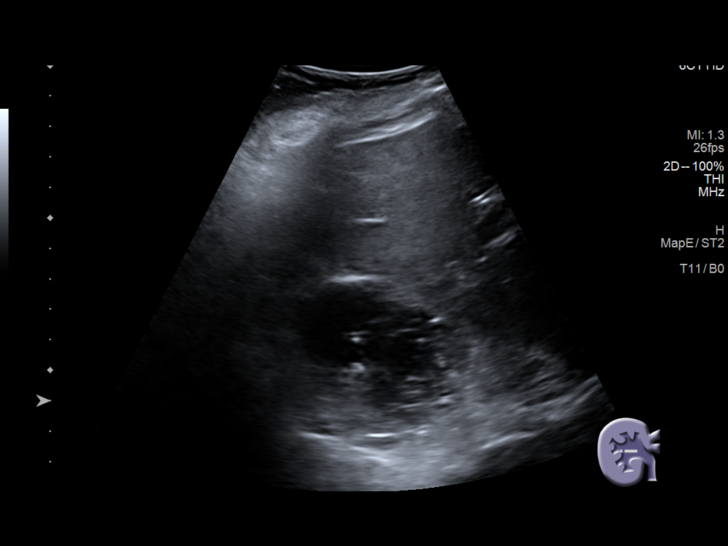
[im 49/106]
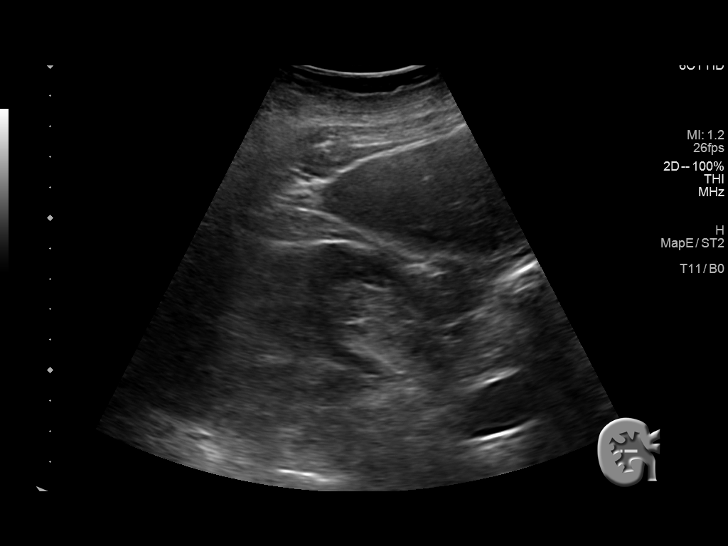
[im 57/106]
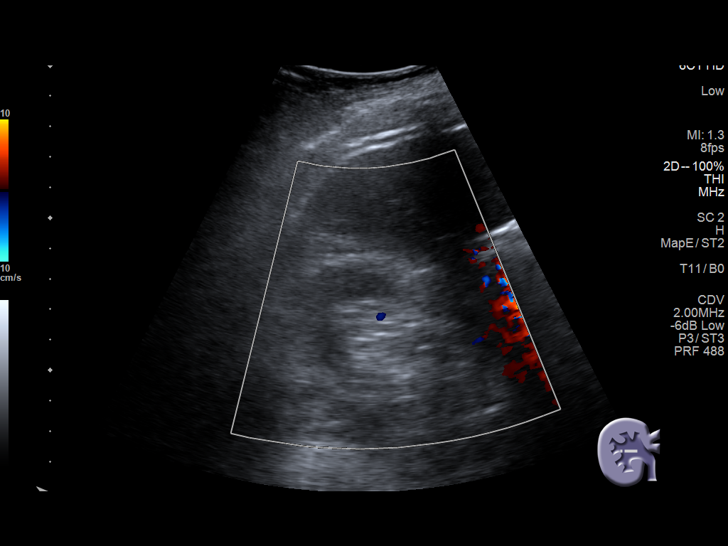
[im 66/106]
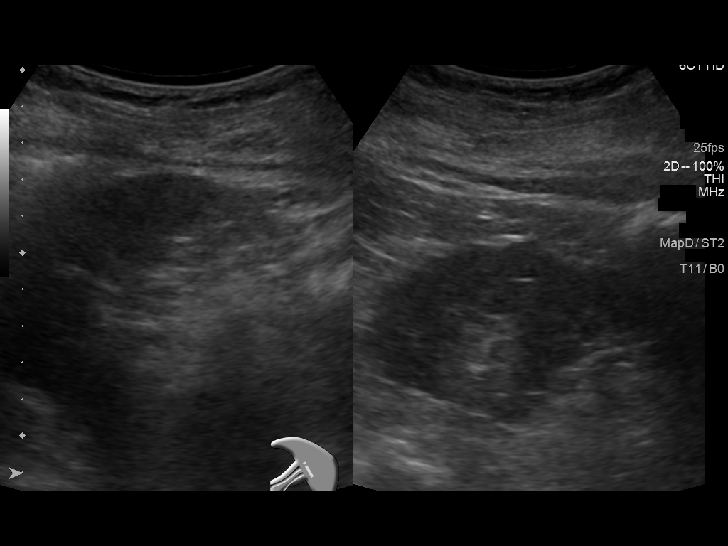
[im 71/106]
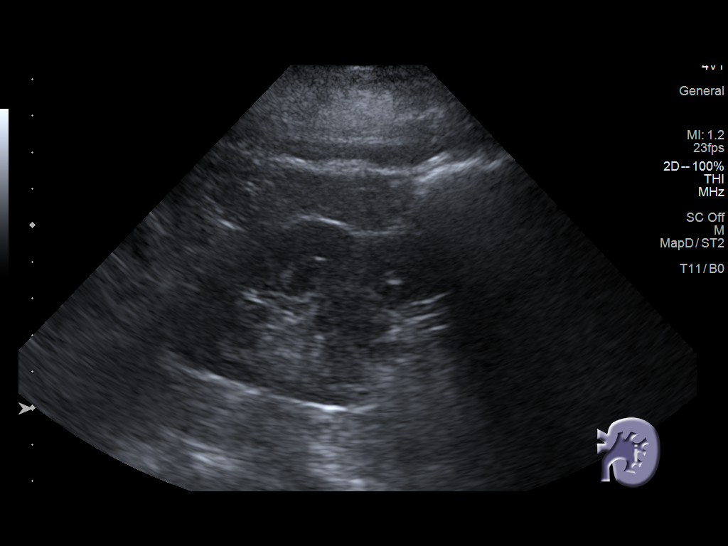
[im 79/106]
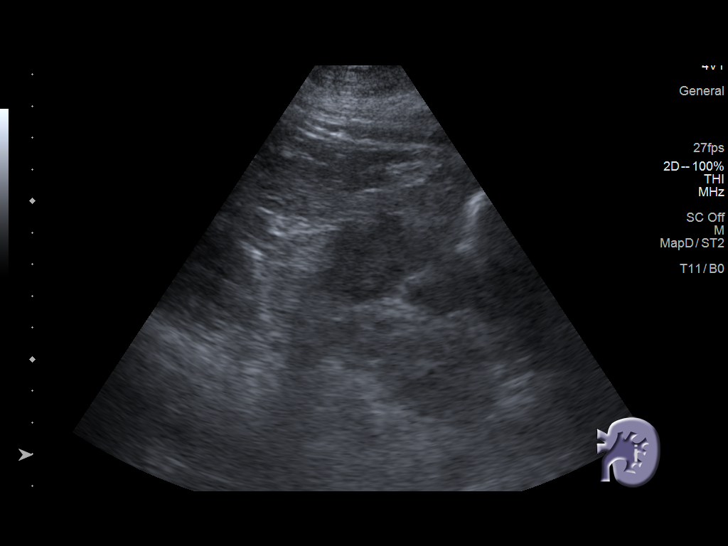
[im 88/106]
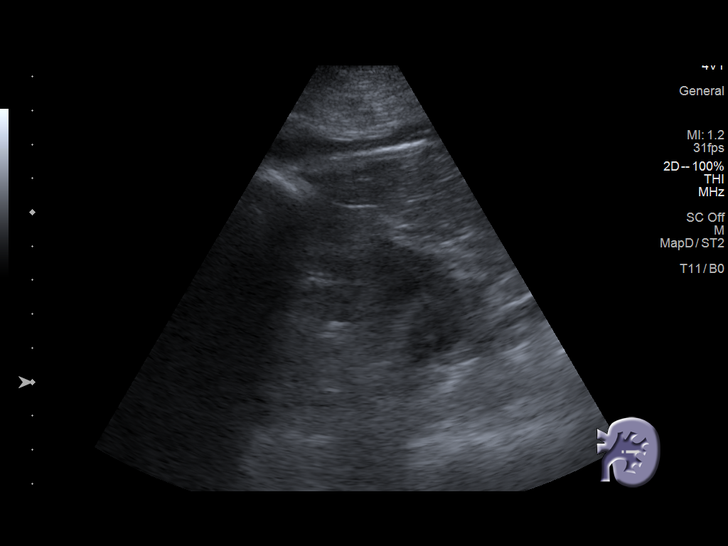
[im 97/106]
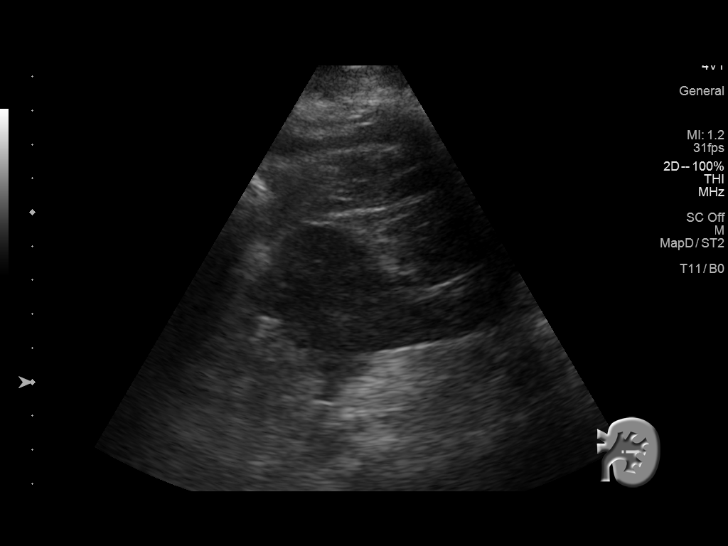
[im 106/106]
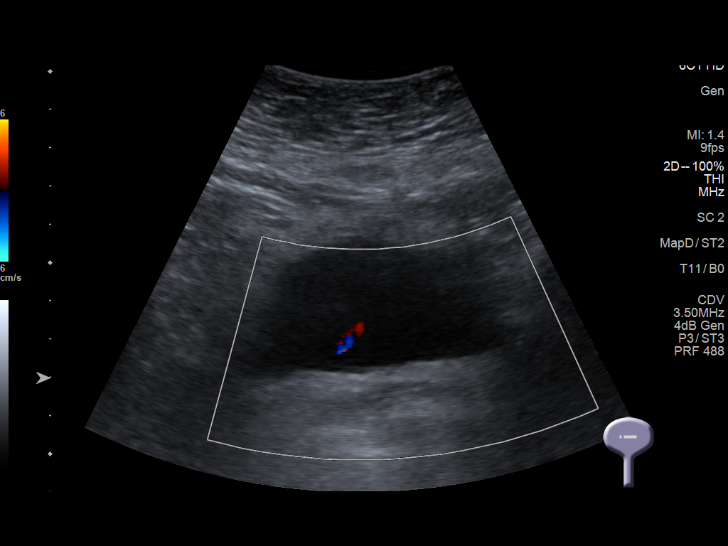

[14 of 25 positions shown; findings below may reference images not displayed]

FINDINGS: Right Kidney:

Renal measurements: 10.4 x 4.2 x 4.4 cm = volume: 100 mL. 5.6 cm
complex upper pole cyst with internal septations and echoes,
unchanged since prior study. 2.5 cm cyst in the lower pole. Thin
internal septation now present. Overall size not significantly
changed. Normal echotexture. No hydronephrosis.

Left Kidney:

Renal measurements: 10.3 x 5.0 x 4.5 cm = volume: 123 mL. Limited
visualization due to overlying bowel gas. Previously seen 1 cm cyst
not definitively seen on today's study. No hydronephrosis. Normal
echotexture.

Bladder:

Appears normal for degree of bladder distention.

Other:

None
IMPRESSION: Right renal cysts, not significantly changed since prior study.

No acute findings.  No hydronephrosis.

## 2023-10-21 DEATH — deceased
